# Patient Record
Sex: Female | Born: 1985 | Hispanic: No | Smoking: Never smoker
Health system: Southern US, Community
[De-identification: ages and names within clinical notes are randomized; demographics above are authoritative.]

## PROBLEM LIST (undated history)

## (undated) DIAGNOSIS — J45909 Unspecified asthma, uncomplicated: Secondary | ICD-10-CM

## (undated) DIAGNOSIS — T7840XA Allergy, unspecified, initial encounter: Secondary | ICD-10-CM

## (undated) DIAGNOSIS — I10 Essential (primary) hypertension: Secondary | ICD-10-CM

## (undated) DIAGNOSIS — E876 Hypokalemia: Secondary | ICD-10-CM

## (undated) DIAGNOSIS — D219 Benign neoplasm of connective and other soft tissue, unspecified: Secondary | ICD-10-CM

## (undated) HISTORY — DX: Essential (primary) hypertension: I10

## (undated) HISTORY — PX: NO PAST SURGERIES: SHX2092

## (undated) HISTORY — DX: Benign neoplasm of connective and other soft tissue, unspecified: D21.9

## (undated) HISTORY — DX: Allergy, unspecified, initial encounter: T78.40XA

---

## 2006-03-04 LAB — OB RESULTS CONSOLE VARICELLA ZOSTER ANTIBODY, IGG: VARICELLA IGG: IMMUNE

## 2006-04-24 ENCOUNTER — Ambulatory Visit (HOSPITAL_COMMUNITY): Admission: RE | Admit: 2006-04-24 | Discharge: 2006-04-24 | Payer: Self-pay | Admitting: Family Medicine

## 2006-08-09 ENCOUNTER — Inpatient Hospital Stay (HOSPITAL_COMMUNITY): Admission: AD | Admit: 2006-08-09 | Discharge: 2006-08-11 | Payer: Self-pay | Admitting: Family Medicine

## 2006-08-09 ENCOUNTER — Encounter (INDEPENDENT_AMBULATORY_CARE_PROVIDER_SITE_OTHER): Payer: Self-pay | Admitting: Specialist

## 2006-08-09 ENCOUNTER — Ambulatory Visit: Payer: Self-pay | Admitting: Obstetrics and Gynecology

## 2006-08-09 DIAGNOSIS — O1490 Unspecified pre-eclampsia, unspecified trimester: Secondary | ICD-10-CM

## 2006-08-12 ENCOUNTER — Encounter: Admission: RE | Admit: 2006-08-12 | Discharge: 2006-08-20 | Payer: Self-pay | Admitting: Obstetrics & Gynecology

## 2010-07-16 ENCOUNTER — Ambulatory Visit (INDEPENDENT_AMBULATORY_CARE_PROVIDER_SITE_OTHER): Payer: BC Managed Care – HMO | Admitting: Gynecology

## 2010-07-16 ENCOUNTER — Other Ambulatory Visit (HOSPITAL_COMMUNITY)
Admission: RE | Admit: 2010-07-16 | Discharge: 2010-07-16 | Disposition: A | Discharge status: Home / Self Care | Payer: BC Managed Care – HMO | Source: Ambulatory Visit | Attending: Gynecology | Admitting: Gynecology

## 2010-07-16 ENCOUNTER — Other Ambulatory Visit: Payer: Self-pay | Admitting: Gynecology

## 2010-07-16 DIAGNOSIS — Z01419 Encounter for gynecological examination (general) (routine) without abnormal findings: Secondary | ICD-10-CM

## 2010-07-16 DIAGNOSIS — N926 Irregular menstruation, unspecified: Secondary | ICD-10-CM

## 2010-07-16 DIAGNOSIS — Z124 Encounter for screening for malignant neoplasm of cervix: Secondary | ICD-10-CM | POA: Insufficient documentation

## 2010-07-17 ENCOUNTER — Ambulatory Visit (INDEPENDENT_AMBULATORY_CARE_PROVIDER_SITE_OTHER): Payer: BC Managed Care – HMO | Admitting: Gynecology

## 2010-07-17 ENCOUNTER — Other Ambulatory Visit: Payer: BC Managed Care – HMO

## 2010-07-17 DIAGNOSIS — D3910 Neoplasm of uncertain behavior of unspecified ovary: Secondary | ICD-10-CM

## 2010-07-17 DIAGNOSIS — N926 Irregular menstruation, unspecified: Secondary | ICD-10-CM

## 2010-07-17 DIAGNOSIS — N83209 Unspecified ovarian cyst, unspecified side: Secondary | ICD-10-CM

## 2010-11-14 ENCOUNTER — Encounter: Payer: Self-pay | Admitting: *Deleted

## 2010-11-15 ENCOUNTER — Encounter: Payer: Self-pay | Admitting: Gynecology

## 2010-11-15 ENCOUNTER — Ambulatory Visit (INDEPENDENT_AMBULATORY_CARE_PROVIDER_SITE_OTHER): Payer: BC Managed Care – HMO | Admitting: Gynecology

## 2010-11-15 DIAGNOSIS — N912 Amenorrhea, unspecified: Secondary | ICD-10-CM

## 2010-11-15 MED ORDER — MEDROXYPROGESTERONE ACETATE 10 MG PO TABS: 10.0000 mg | ORAL_TABLET | Freq: Two times a day (BID) | ORAL | Status: DC

## 2010-11-15 NOTE — Progress Notes (Signed)
Patient presents having her last menstrual period 09/15/2010. She was seen in April after missing march and April menses had a negative hCG was withdrawn on Provera.  She had a menses May and June and then now has skipped again.  She's not having any skin hair weight changes.  Exam Abdomen: Soft nontender without masses guarding rebound organomegaly Pelvic: External BUS vagina normal, cervix normal, uterus normal size midline mobile nontender adnexa without masses or tenderness  Assessment plan #1 Irregular menses. UPT is negative today. We'll go ahead and withdraw her on Provera 10 mg twice a day x5 days. Check baseline TSH prolactin.  She and her husband are wanting to get pregnant. I again emphasized the need to see her primary and to switch her from lisinopril to another antihypertensive before pregnancy and she is in the process of arranging this. She is on a prenatal vitamin will continue on this. I reviewed with her that once we get serious about trying for pregnancy if she continues with irregular menses we may want to consider Clomid stimulation. What is involved with Clomid was discussed to include the risks of hyperstimulation syndrome, multiple gestation as well as possible ovarian cancer leakage. Patient will go ahead and withdraw with Provera.  Assuming she has withdrawal,  to keep a menstrual calendar and report if she has another bout of amenorrhea.  She will followup on her prolactin TSH results. She does have an appointment to see her primary next month to switch from lisinopril.

## 2010-11-23 ENCOUNTER — Telehealth: Payer: Self-pay | Admitting: *Deleted

## 2010-11-23 NOTE — Telephone Encounter (Signed)
Pt called wanting lab result from last office visit. Lm on pt vm that all labs came back normal.

## 2011-10-30 ENCOUNTER — Encounter: Payer: BC Managed Care – HMO | Admitting: Gynecology

## 2014-02-07 ENCOUNTER — Encounter: Payer: Self-pay | Admitting: Gynecology

## 2015-02-27 ENCOUNTER — Other Ambulatory Visit (HOSPITAL_COMMUNITY): Payer: Self-pay | Admitting: Nurse Practitioner

## 2015-02-27 DIAGNOSIS — Z3682 Encounter for antenatal screening for nuchal translucency: Secondary | ICD-10-CM

## 2015-02-27 LAB — OB RESULTS CONSOLE HGB/HCT, BLOOD
HCT: 38 %
Hemoglobin: 12.3 g/dL

## 2015-02-27 LAB — CYSTIC FIBROSIS DIAGNOSTIC STUDY

## 2015-02-27 LAB — OB RESULTS CONSOLE ABO/RH: RH TYPE: POSITIVE

## 2015-02-27 LAB — OB RESULTS CONSOLE GC/CHLAMYDIA
CHLAMYDIA, DNA PROBE: NEGATIVE
GC PROBE AMP, GENITAL: NEGATIVE

## 2015-02-27 LAB — GLUCOSE TOLERANCE, 1 HOUR: GLUCOSE 1 HOUR GTT: 126

## 2015-02-27 LAB — OB RESULTS CONSOLE HEPATITIS B SURFACE ANTIGEN: HEP B S AG: NEGATIVE

## 2015-02-27 LAB — OB RESULTS CONSOLE PLATELET COUNT: Platelets: 178 10*3/uL

## 2015-02-27 LAB — OB RESULTS CONSOLE RPR: RPR: NONREACTIVE

## 2015-02-27 LAB — OB RESULTS CONSOLE RUBELLA ANTIBODY, IGM: Rubella: IMMUNE

## 2015-02-27 LAB — OB RESULTS CONSOLE HIV ANTIBODY (ROUTINE TESTING): HIV: NONREACTIVE

## 2015-02-27 LAB — CYTOLOGY - PAP: PAP SMEAR: NEGATIVE

## 2015-02-27 LAB — OB RESULTS CONSOLE ANTIBODY SCREEN: Antibody Screen: NEGATIVE

## 2015-03-15 ENCOUNTER — Ambulatory Visit (HOSPITAL_COMMUNITY)
Admission: RE | Admit: 2015-03-15 | Discharge: 2015-03-15 | Disposition: A | Discharge status: Home / Self Care | Payer: Medicaid Other | Source: Ambulatory Visit | Attending: Nurse Practitioner | Admitting: Nurse Practitioner

## 2015-03-15 ENCOUNTER — Encounter (HOSPITAL_COMMUNITY): Payer: Self-pay

## 2015-03-15 DIAGNOSIS — Z36 Encounter for antenatal screening of mother: Secondary | ICD-10-CM | POA: Insufficient documentation

## 2015-03-15 DIAGNOSIS — O3411 Maternal care for benign tumor of corpus uteri, first trimester: Secondary | ICD-10-CM | POA: Insufficient documentation

## 2015-03-15 DIAGNOSIS — O09291 Supervision of pregnancy with other poor reproductive or obstetric history, first trimester: Secondary | ICD-10-CM | POA: Diagnosis not present

## 2015-03-15 DIAGNOSIS — O09211 Supervision of pregnancy with history of pre-term labor, first trimester: Secondary | ICD-10-CM | POA: Insufficient documentation

## 2015-03-15 DIAGNOSIS — O10011 Pre-existing essential hypertension complicating pregnancy, first trimester: Secondary | ICD-10-CM | POA: Insufficient documentation

## 2015-03-15 DIAGNOSIS — Z3682 Encounter for antenatal screening for nuchal translucency: Secondary | ICD-10-CM

## 2015-03-15 DIAGNOSIS — Z3A13 13 weeks gestation of pregnancy: Secondary | ICD-10-CM | POA: Diagnosis not present

## 2015-03-16 ENCOUNTER — Encounter: Payer: Self-pay | Admitting: Obstetrics & Gynecology

## 2015-03-16 ENCOUNTER — Encounter: Payer: Self-pay | Admitting: *Deleted

## 2015-03-16 ENCOUNTER — Ambulatory Visit (INDEPENDENT_AMBULATORY_CARE_PROVIDER_SITE_OTHER): Payer: Self-pay | Admitting: Obstetrics & Gynecology

## 2015-03-16 VITALS — BP 136/84 | HR 86 | Temp 98.7°F | Ht 61.0 in | Wt 175.8 lb

## 2015-03-16 DIAGNOSIS — O162 Unspecified maternal hypertension, second trimester: Secondary | ICD-10-CM

## 2015-03-16 DIAGNOSIS — O10919 Unspecified pre-existing hypertension complicating pregnancy, unspecified trimester: Secondary | ICD-10-CM | POA: Insufficient documentation

## 2015-03-16 DIAGNOSIS — O0990 Supervision of high risk pregnancy, unspecified, unspecified trimester: Secondary | ICD-10-CM | POA: Insufficient documentation

## 2015-03-16 DIAGNOSIS — O09292 Supervision of pregnancy with other poor reproductive or obstetric history, second trimester: Secondary | ICD-10-CM

## 2015-03-16 DIAGNOSIS — O09299 Supervision of pregnancy with other poor reproductive or obstetric history, unspecified trimester: Secondary | ICD-10-CM | POA: Insufficient documentation

## 2015-03-16 DIAGNOSIS — O0992 Supervision of high risk pregnancy, unspecified, second trimester: Secondary | ICD-10-CM

## 2015-03-16 LAB — POCT URINALYSIS DIP (DEVICE)
BILIRUBIN URINE: NEGATIVE
Glucose, UA: NEGATIVE mg/dL
Hgb urine dipstick: NEGATIVE
Ketones, ur: NEGATIVE mg/dL
NITRITE: NEGATIVE
Protein, ur: NEGATIVE mg/dL
Specific Gravity, Urine: 1.025 (ref 1.005–1.030)
UROBILINOGEN UA: 0.2 mg/dL (ref 0.0–1.0)
pH: 6 (ref 5.0–8.0)

## 2015-03-16 NOTE — Progress Notes (Signed)
   Subjective:transfer for h/o HTN and preeclampsia    Jasmine Baird is a [id number] [redacted]w[redacted]d being seen today for her first obstetrical visit.  Her obstetrical history is significant for pre-eclampsia and HTN, previously taking lisinopril. Patient does intend to breast feed. Pregnancy history fully reviewed.  Patient reports no complaints.  Filed Vitals:   03/16/15 0749 03/16/15 0750  BP: 136/84   Pulse: 86   Temp: 98.7 F (37.1 C)   Height:  5\' 1"  (1.549 m)  Weight: 175 lb 12.8 oz (79.742 kg)     HISTORY: OB History  Gravida Para Term Preterm AB SAB TAB Ectopic Multiple Living  3 1 0 1 1 0 1 0 0 1     # Outcome Date GA Lbr Len/2nd Weight Sex Delivery Anes PTL Lv  3 Current           2 Preterm 08/09/06 [redacted]w[redacted]d  4 lb 9 oz (2.07 kg) M Vag-Spont  Y Y     Complications: Pre-eclampsia  1 TAB              Past Medical History  Diagnosis Date  . Hypertension    History reviewed. No pertinent past surgical history. Family History  Problem Relation Age of Onset  . Hypertension Mother   . Hypertension Father   . Hyperlipidemia Father   . Heart disease Maternal Uncle   . Diabetes Maternal Grandmother   . Hypertension Maternal Grandmother   . Cancer Maternal Grandfather 21    PANCREATIC     Exam    Uterus:     Pelvic Exam:                               System:     Skin: normal coloration and turgor, no rashes    Neurologic: oriented, normal mood   Extremities: normal strength, tone, and muscle mass   HEENT extra ocular movement intact   Mouth/Teeth dental hygiene good   Neck supple   Cardiovascular: regular rate and rhythm   Respiratory:  appears well, vitals normal, no respiratory distress, acyanotic, normal RR, ear and throat exam is normal   Abdomen: soft, non-tender; bowel sounds normal; no masses,  no organomegaly   Urinary:        Assessment:    Pregnancy: [id number] Patient Active Problem List   Diagnosis Date Noted  . Supervision of high risk  pregnancy, antepartum 03/16/2015  . Hx of preeclampsia, prior pregnancy, currently pregnant 03/16/2015  CHTN history off meds now      Plan:     Initial labs drawn. Prenatal vitamins. Problem list reviewed and updated. Genetic Screening discussed First Screen: results reviewed.normal  Ultrasound discussed; fetal survey: ordered.  Follow up in 4 weeks. 50% of 30 min visit spent on counseling and coordination of care.  ASA 81 mg daily   Eriel Dunckel 03/16/2015

## 2015-03-16 NOTE — Progress Notes (Signed)
Anatomy U/S 04/20/15 @ 10a with [place].

## 2015-03-16 NOTE — Progress Notes (Signed)
Nutrition note: 1st visit consult Pt has h/o obesity. Pt has gained 15.8# @ [redacted]w[redacted]d, which is > expected. Pt reports eating 3 meals & 3 snacks/d. Pt is taking a PNV. Pt reports no N&V or heartburn. NKFA. Pt received verbal & written education on general nutrition during pregnancy.  Discussed wt gain goals of 11-20# or 0.5#/wk. Pt agrees to continue taking her PNV. Pt does not have [place] but plans to apply. Pt plans to BF. F/u as needed Vladimir Faster, MS, RD, LDN, [facility]

## 2015-03-16 NOTE — Patient Instructions (Signed)

## 2015-03-28 ENCOUNTER — Other Ambulatory Visit (HOSPITAL_COMMUNITY): Payer: Self-pay

## 2015-04-09 NOTE — L&D Delivery Note (Signed)
Patient is 30 y.o. [id number] [redacted]w[redacted]d admitted IOL out of concern for SIPE   Delivery Note At 3:35 AM a viable female was delivered via Vaginal, Spontaneous Delivery (Presentation: Left Occiput Anterior).  APGAR: 9, 9; weight pending  .   Placenta status: Intact, Spontaneous.  Cord: 3 vessels with the following complications: Knot.  Cord pH: n/a   Anesthesia: Epidural  Episiotomy: None Lacerations: 2nd degree Suture Repair: 3.0 Est. Blood Loss (mL): 300. Placed rectal Cytotec (prophylactic)    Mom to postpartum.  Baby to Couplet care / Skin to Skin.  Mercy Riding 09/07/2015, 4:22 AM  I was present for the entire delivery of baby and placenta and repair and agree with above.  [place], CNM 09/07/2015 8:01 AM

## 2015-04-13 ENCOUNTER — Ambulatory Visit (INDEPENDENT_AMBULATORY_CARE_PROVIDER_SITE_OTHER): Payer: Medicaid Other | Admitting: Family

## 2015-04-13 VITALS — BP 141/91 | HR 93 | Temp 99.0°F | Wt 180.5 lb

## 2015-04-13 DIAGNOSIS — O162 Unspecified maternal hypertension, second trimester: Secondary | ICD-10-CM | POA: Diagnosis not present

## 2015-04-13 DIAGNOSIS — O09292 Supervision of pregnancy with other poor reproductive or obstetric history, second trimester: Secondary | ICD-10-CM | POA: Diagnosis not present

## 2015-04-13 DIAGNOSIS — O09213 Supervision of pregnancy with history of pre-term labor, third trimester: Secondary | ICD-10-CM

## 2015-04-13 DIAGNOSIS — O0992 Supervision of high risk pregnancy, unspecified, second trimester: Secondary | ICD-10-CM

## 2015-04-13 LAB — POCT URINALYSIS DIP (DEVICE)
BILIRUBIN URINE: NEGATIVE
GLUCOSE, UA: NEGATIVE mg/dL
Hgb urine dipstick: NEGATIVE
KETONES UR: NEGATIVE mg/dL
Nitrite: NEGATIVE
PROTEIN: NEGATIVE mg/dL
Urobilinogen, UA: 0.2 mg/dL (ref 0.0–1.0)
pH: 5.5 (ref 5.0–8.0)

## 2015-04-13 NOTE — Progress Notes (Signed)
Subjective:  Jasmine Baird is a 30 y.o. [phone] at [redacted]w[redacted]d being seen today for ongoing prenatal care.  She is currently monitored for the following issues for this high-risk pregnancy and has supervision of high risk pregnancy, antepartum; Hx of preeclampsia, prior pregnancy, currently pregnant; hypertension affecting pregnancy, antepartum; and previous preterm delivery in third trimester, antepartum on her problem list.  Patient reports no complaints.  Contractions: Not present. Vag. Bleeding: None.   . Denies leaking of fluid.   The following portions of the patient's history were reviewed and updated as appropriate: allergies, current medications, past family history, past medical history, past social history, past surgical history and problem list. Problem list updated.  Objective:   Filed Vitals:   04/13/15 0801  BP: 141/91  Pulse: 93  Temp: 99 F (37.2 C)  Weight: 180 lb 8 oz (81.874 kg)    Fetal Status: Fetal Heart Rate (bpm): 145 Fundal Height: 17 cm       General:  Alert, oriented and cooperative. Patient is in no acute distress.  Skin: Skin is warm and dry. No rash noted.   Cardiovascular: Normal heart rate noted  Respiratory: Normal respiratory effort, no problems with respiration noted  Abdomen: Soft, gravid, appropriate for gestational age. Pain/Pressure: Present     Pelvic: Vag. Bleeding: None     Cervical exam deferred        Extremities: Normal range of motion.  Edema: Trace  Mental Status: Normal mood and affect. Normal behavior. Normal judgment and thought content.   Urinalysis:     Urine results not available at discharge.     Assessment and Plan:  Pregnancy: [id number] at [redacted]w[redacted]d  1. Supervision of high risk pregnancy, antepartum, second trimester - Reviewed prenatal labs and NT results  2. Hx of preeclampsia, prior pregnancy, currently pregnant, second trimester - Taking baby ASA - Obtain results from Health Dept for Prex labs  3. Hypertension affecting  pregnancy, antepartum, second trimester - Follow-up next week for BP check per patient request  4. Previous preterm delivery in third trimester, antepartum - Given information regarding 17-p and will decide  Preterm labor symptoms and general obstetric precautions including but not limited to vaginal bleeding, contractions, leaking of fluid and fetal movement were reviewed in detail with the patient. Please refer to After Visit Summary for other counseling recommendations.  Return in about 1 week (around 04/20/2015) for BP check only; 3 wks appt.   Venia Carbon Michiel Cowboy, CNM

## 2015-04-20 ENCOUNTER — Encounter (HOSPITAL_COMMUNITY): Payer: Self-pay

## 2015-04-20 ENCOUNTER — Encounter: Payer: Self-pay | Admitting: *Deleted

## 2015-04-20 ENCOUNTER — Other Ambulatory Visit: Payer: Self-pay | Admitting: General Practice

## 2015-04-20 ENCOUNTER — Ambulatory Visit: Payer: Medicaid Other | Admitting: *Deleted

## 2015-04-20 ENCOUNTER — Ambulatory Visit (HOSPITAL_COMMUNITY)
Admission: RE | Admit: 2015-04-20 | Discharge: 2015-04-20 | Disposition: A | Discharge status: Home / Self Care | Payer: Medicaid Other | Source: Ambulatory Visit | Attending: Obstetrics & Gynecology | Admitting: Obstetrics & Gynecology

## 2015-04-20 VITALS — BP 125/78 | HR 89

## 2015-04-20 DIAGNOSIS — O10012 Pre-existing essential hypertension complicating pregnancy, second trimester: Secondary | ICD-10-CM | POA: Diagnosis present

## 2015-04-20 DIAGNOSIS — O09292 Supervision of pregnancy with other poor reproductive or obstetric history, second trimester: Secondary | ICD-10-CM

## 2015-04-20 DIAGNOSIS — O162 Unspecified maternal hypertension, second trimester: Secondary | ICD-10-CM

## 2015-04-20 DIAGNOSIS — O09219 Supervision of pregnancy with history of pre-term labor, unspecified trimester: Secondary | ICD-10-CM

## 2015-04-20 DIAGNOSIS — O09899 Supervision of other high risk pregnancies, unspecified trimester: Secondary | ICD-10-CM

## 2015-04-20 DIAGNOSIS — Z3A18 18 weeks gestation of pregnancy: Secondary | ICD-10-CM

## 2015-04-20 DIAGNOSIS — Z36 Encounter for antenatal screening of mother: Secondary | ICD-10-CM | POA: Insufficient documentation

## 2015-04-20 DIAGNOSIS — O09212 Supervision of pregnancy with history of pre-term labor, second trimester: Secondary | ICD-10-CM | POA: Diagnosis not present

## 2015-04-20 DIAGNOSIS — D259 Leiomyoma of uterus, unspecified: Secondary | ICD-10-CM

## 2015-04-20 DIAGNOSIS — O3412 Maternal care for benign tumor of corpus uteri, second trimester: Secondary | ICD-10-CM | POA: Insufficient documentation

## 2015-04-20 DIAGNOSIS — Z0130 Encounter for examination of blood pressure without abnormal findings: Secondary | ICD-10-CM

## 2015-04-20 DIAGNOSIS — Z3689 Encounter for other specified antenatal screening: Secondary | ICD-10-CM

## 2015-04-20 DIAGNOSIS — O0992 Supervision of high risk pregnancy, unspecified, second trimester: Secondary | ICD-10-CM

## 2015-04-20 NOTE — Progress Notes (Signed)
Pt in for BP Check. Feeling well. BP 125/78.

## 2015-05-02 ENCOUNTER — Encounter: Payer: Self-pay | Admitting: Family

## 2015-05-04 ENCOUNTER — Telehealth: Payer: Self-pay

## 2015-05-04 ENCOUNTER — Ambulatory Visit (INDEPENDENT_AMBULATORY_CARE_PROVIDER_SITE_OTHER): Payer: Medicaid Other | Admitting: Family

## 2015-05-04 VITALS — BP 126/83 | HR 102 | Wt 180.1 lb

## 2015-05-04 DIAGNOSIS — O10912 Unspecified pre-existing hypertension complicating pregnancy, second trimester: Secondary | ICD-10-CM

## 2015-05-04 DIAGNOSIS — O09292 Supervision of pregnancy with other poor reproductive or obstetric history, second trimester: Secondary | ICD-10-CM

## 2015-05-04 DIAGNOSIS — O162 Unspecified maternal hypertension, second trimester: Secondary | ICD-10-CM

## 2015-05-04 DIAGNOSIS — O09293 Supervision of pregnancy with other poor reproductive or obstetric history, third trimester: Secondary | ICD-10-CM

## 2015-05-04 DIAGNOSIS — O09212 Supervision of pregnancy with history of pre-term labor, second trimester: Secondary | ICD-10-CM | POA: Diagnosis not present

## 2015-05-04 DIAGNOSIS — O0992 Supervision of high risk pregnancy, unspecified, second trimester: Secondary | ICD-10-CM

## 2015-05-04 DIAGNOSIS — O09213 Supervision of pregnancy with history of pre-term labor, third trimester: Secondary | ICD-10-CM

## 2015-05-04 LAB — POCT URINALYSIS DIP (DEVICE)
Glucose, UA: NEGATIVE mg/dL
Hgb urine dipstick: NEGATIVE
Ketones, ur: NEGATIVE mg/dL
NITRITE: NEGATIVE
Protein, ur: 30 mg/dL — AB
Specific Gravity, Urine: 1.02 (ref 1.005–1.030)
UROBILINOGEN UA: 0.2 mg/dL (ref 0.0–1.0)
pH: 6 (ref 5.0–8.0)

## 2015-05-04 NOTE — Progress Notes (Signed)
Subjective:  Jasmine Baird is a 30 y.o. [phone] at [redacted]w[redacted]d being seen today for ongoing prenatal care.  She is currently monitored for the following issues for this high-risk pregnancy and has supervision of high risk pregnancy, antepartum; Hx of preeclampsia, prior pregnancy, currently pregnant; Hypertension affecting pregnancy, antepartum; and Previous preterm delivery in third trimester, antepartum on her problem list.  Patient reports no complaints.  Agrees to doing 17-p.  Contractions: Not present. Vag. Bleeding: None.  Movement: Present. Denies leaking of fluid.   The following portions of the patient's history were reviewed and updated as appropriate: allergies, current medications, past family history, past medical history, past social history, past surgical history and problem list. Problem list updated.  Objective:   Filed Vitals:   05/04/15 0810  BP: 126/83  Pulse: 102  Weight: 180 lb 1.6 oz (81.693 kg)    Fetal Status: Fetal Heart Rate (bpm): 143 Fundal Height: 21 cm Movement: Present     General:  Alert, oriented and cooperative. Patient is in no acute distress.  Skin: Skin is warm and dry. No rash noted.   Cardiovascular: Normal heart rate noted  Respiratory: Normal respiratory effort, no problems with respiration noted  Abdomen: Soft, gravid, appropriate for gestational age. Pain/Pressure: Absent     Pelvic: Vag. Bleeding: None     Cervical exam deferred        Extremities: Normal range of motion.  Edema: Trace  Mental Status: Normal mood and affect. Normal behavior. Normal judgment and thought content.   Urinalysis: Urine Protein: 1+ Urine Glucose: Negative  Assessment and Plan:  Pregnancy: [id number] at [redacted]w[redacted]d  1. Hx of preeclampsia, prior pregnancy, currently pregnant, third trimester - Continue ASA  2. Supervision of high risk pregnancy, antepartum, second trimester - Reviewed lab results  3. Previous preterm delivery in third trimester, antepartum - Signed  [id number] application  Preterm labor symptoms and general obstetric precautions including but not limited to vaginal bleeding, contractions, leaking of fluid and fetal movement were reviewed in detail with the patient. Please refer to After Visit Summary for other counseling recommendations.  Return in about 3 weeks (around 05/25/2015).  Return sooner once 17-p arrives.     Venia Carbon Michiel Cowboy, CNM

## 2015-05-04 NOTE — Telephone Encounter (Signed)
At the request of Walidah please obtain this patient 24 hour urine and cmp from the health dept. I ask Butch Penny from the health department to check on these results. She has put in a request from the health department.

## 2015-05-04 NOTE — Patient Instructions (Signed)

## 2015-05-05 ENCOUNTER — Encounter: Payer: Self-pay | Admitting: Family

## 2015-05-08 ENCOUNTER — Telehealth: Payer: Self-pay

## 2015-05-08 NOTE — Telephone Encounter (Signed)
Called and informed pt that we have received her Makena.  Pt stated that she will be able to come in on Thursday, 05/11/15 @ 0800 for her first injection.

## 2015-05-11 ENCOUNTER — Ambulatory Visit (INDEPENDENT_AMBULATORY_CARE_PROVIDER_SITE_OTHER): Payer: Medicaid Other | Admitting: General Practice

## 2015-05-11 VITALS — BP 130/83 | HR 115 | Temp 98.1°F | Ht 61.0 in | Wt 182.0 lb

## 2015-05-11 DIAGNOSIS — O09213 Supervision of pregnancy with history of pre-term labor, third trimester: Secondary | ICD-10-CM | POA: Diagnosis not present

## 2015-05-11 MED ORDER — HYDROXYPROGESTERONE CAPROATE 250 MG/ML IM OIL
250.0000 mg | TOPICAL_OIL | INTRAMUSCULAR | Status: AC
  Administered 2015-05-11 – 2015-08-15 (×14): 250 mg via INTRAMUSCULAR

## 2015-05-18 ENCOUNTER — Ambulatory Visit (INDEPENDENT_AMBULATORY_CARE_PROVIDER_SITE_OTHER): Payer: Medicaid Other

## 2015-05-18 VITALS — BP 135/75 | HR 91 | Temp 98.5°F | Wt 183.5 lb

## 2015-05-18 DIAGNOSIS — O09213 Supervision of pregnancy with history of pre-term labor, third trimester: Secondary | ICD-10-CM | POA: Diagnosis present

## 2015-05-25 ENCOUNTER — Ambulatory Visit (INDEPENDENT_AMBULATORY_CARE_PROVIDER_SITE_OTHER): Payer: Medicaid Other

## 2015-05-25 VITALS — BP 124/93 | HR 92 | Temp 98.5°F | Wt 185.6 lb

## 2015-05-25 DIAGNOSIS — O0992 Supervision of high risk pregnancy, unspecified, second trimester: Secondary | ICD-10-CM

## 2015-05-26 ENCOUNTER — Encounter: Payer: Self-pay | Admitting: Family

## 2015-06-01 ENCOUNTER — Ambulatory Visit (INDEPENDENT_AMBULATORY_CARE_PROVIDER_SITE_OTHER): Payer: Medicaid Other | Admitting: Family

## 2015-06-01 VITALS — BP 127/72 | HR 100 | Temp 98.4°F | Wt 186.6 lb

## 2015-06-01 DIAGNOSIS — O0992 Supervision of high risk pregnancy, unspecified, second trimester: Secondary | ICD-10-CM

## 2015-06-01 DIAGNOSIS — O09213 Supervision of pregnancy with history of pre-term labor, third trimester: Secondary | ICD-10-CM

## 2015-06-01 DIAGNOSIS — O09212 Supervision of pregnancy with history of pre-term labor, second trimester: Secondary | ICD-10-CM | POA: Diagnosis not present

## 2015-06-01 LAB — POCT URINALYSIS DIP (DEVICE)
Bilirubin Urine: NEGATIVE
Glucose, UA: NEGATIVE mg/dL
HGB URINE DIPSTICK: NEGATIVE
Ketones, ur: NEGATIVE mg/dL
Nitrite: NEGATIVE
PH: 6 (ref 5.0–8.0)
PROTEIN: NEGATIVE mg/dL
SPECIFIC GRAVITY, URINE: 1.02 (ref 1.005–1.030)
UROBILINOGEN UA: 0.2 mg/dL (ref 0.0–1.0)

## 2015-06-01 NOTE — Progress Notes (Signed)
Subjective:  Jasmine Baird is a 30 y.o. [phone] at [redacted]w[redacted]d being seen today for ongoing prenatal care.  She is currently monitored for the following issues for this high-risk pregnancy and has Supervision of high risk pregnancy, antepartum; Hx of preeclampsia, prior pregnancy, currently pregnant; Hypertension affecting pregnancy, antepartum; and Previous preterm delivery in third trimester, antepartum on her problem list.  Patient reports no complaints.  Contractions: Not present. Vag. Bleeding: None.  Movement: Present. Denies leaking of fluid.   The following portions of the patient's history were reviewed and updated as appropriate: allergies, current medications, past family history, past medical history, past social history, past surgical history and problem list. Problem list updated.  Objective:   Filed Vitals:   06/01/15 0753  BP: 127/72  Pulse: 100  Temp: 98.4 F (36.9 C)  Weight: 186 lb 9.6 oz (84.641 kg)    Fetal Status: Fetal Heart Rate (bpm): 156 Fundal Height: 26 cm Movement: Present     General:  Alert, oriented and cooperative. Patient is in no acute distress.  Skin: Skin is warm and dry. No rash noted.   Cardiovascular: Normal heart rate noted  Respiratory: Normal respiratory effort, no problems with respiration noted  Abdomen: Soft, gravid, appropriate for gestational age. Pain/Pressure: Absent     Pelvic: Vag. Bleeding: None     Cervical exam deferred        Extremities: Normal range of motion.     Mental Status: Normal mood and affect. Normal behavior. Normal judgment and thought content.   Urinalysis: Urine Protein: Negative Urine Glucose: Negative  Assessment and Plan:  Pregnancy: [id number] at [redacted]w[redacted]d  1. Previous preterm delivery in third trimester, antepartum - 17p today  2. Supervision of high risk pregnancy, antepartum, second trimester - Reviewed third trimester labs for next visit  Preterm labor symptoms and general obstetric precautions including but  not limited to vaginal bleeding, contractions, leaking of fluid and fetal movement were reviewed in detail with the patient. Please refer to After Visit Summary for other counseling recommendations.  Return in about 3 weeks (around 06/22/2015) for 17p weekly.  Venia Carbon Michiel Cowboy, CNM

## 2015-06-08 ENCOUNTER — Ambulatory Visit (INDEPENDENT_AMBULATORY_CARE_PROVIDER_SITE_OTHER): Payer: Medicaid Other | Admitting: General Practice

## 2015-06-08 VITALS — BP 141/82 | HR 108 | Temp 98.6°F | Wt 188.0 lb

## 2015-06-08 DIAGNOSIS — O09212 Supervision of pregnancy with history of pre-term labor, second trimester: Secondary | ICD-10-CM | POA: Diagnosis present

## 2015-06-08 MED ORDER — HYDROXYPROGESTERONE CAPROATE 250 MG/ML IM OIL
250.0000 mg | TOPICAL_OIL | Freq: Once | INTRAMUSCULAR | Status: AC
  Administered 2015-08-22: 250 mg via INTRAMUSCULAR

## 2015-06-15 ENCOUNTER — Encounter: Payer: Self-pay | Admitting: *Deleted

## 2015-06-15 ENCOUNTER — Ambulatory Visit (INDEPENDENT_AMBULATORY_CARE_PROVIDER_SITE_OTHER): Payer: Medicaid Other | Admitting: *Deleted

## 2015-06-15 VITALS — BP 142/89 | HR 97 | Temp 98.4°F | Wt 191.1 lb

## 2015-06-15 DIAGNOSIS — O09212 Supervision of pregnancy with history of pre-term labor, second trimester: Secondary | ICD-10-CM

## 2015-06-22 ENCOUNTER — Ambulatory Visit (INDEPENDENT_AMBULATORY_CARE_PROVIDER_SITE_OTHER): Payer: Medicaid Other | Admitting: Family

## 2015-06-22 VITALS — BP 117/80 | HR 86 | Temp 98.5°F | Wt 193.8 lb

## 2015-06-22 DIAGNOSIS — O09213 Supervision of pregnancy with history of pre-term labor, third trimester: Secondary | ICD-10-CM

## 2015-06-22 DIAGNOSIS — O162 Unspecified maternal hypertension, second trimester: Secondary | ICD-10-CM | POA: Diagnosis present

## 2015-06-22 DIAGNOSIS — O09212 Supervision of pregnancy with history of pre-term labor, second trimester: Secondary | ICD-10-CM

## 2015-06-22 DIAGNOSIS — Z364 Encounter for antenatal screening for fetal growth retardation: Secondary | ICD-10-CM

## 2015-06-22 DIAGNOSIS — O0993 Supervision of high risk pregnancy, unspecified, third trimester: Secondary | ICD-10-CM

## 2015-06-22 LAB — CBC
HEMATOCRIT: 35.5 % — AB (ref 36.0–46.0)
Hemoglobin: 11.6 g/dL — ABNORMAL LOW (ref 12.0–15.0)
MCH: 27.7 pg (ref 26.0–34.0)
MCHC: 32.7 g/dL (ref 30.0–36.0)
MCV: 84.7 fL (ref 78.0–100.0)
MPV: 11.4 fL (ref 8.6–12.4)
Platelets: 115 10*3/uL — ABNORMAL LOW (ref 150–400)
RBC: 4.19 MIL/uL (ref 3.87–5.11)
RDW: 14 % (ref 11.5–15.5)
WBC: 9.8 10*3/uL (ref 4.0–10.5)

## 2015-06-22 LAB — POCT URINALYSIS DIP (DEVICE)
Bilirubin Urine: NEGATIVE
Glucose, UA: NEGATIVE mg/dL
HGB URINE DIPSTICK: NEGATIVE
Ketones, ur: NEGATIVE mg/dL
LEUKOCYTES UA: NEGATIVE
NITRITE: NEGATIVE
PH: 7 (ref 5.0–8.0)
PROTEIN: NEGATIVE mg/dL
Specific Gravity, Urine: 1.015 (ref 1.005–1.030)
UROBILINOGEN UA: 0.2 mg/dL (ref 0.0–1.0)

## 2015-06-22 NOTE — Progress Notes (Signed)
Subjective:  Jasmine Baird is a 30 y.o. [phone] at [redacted]w[redacted]d being seen today for ongoing prenatal care.  She is currently monitored for the following issues for this high-risk pregnancy and has Supervision of high risk pregnancy, antepartum; Hx of preeclampsia, prior pregnancy, currently pregnant; Hypertension affecting pregnancy, antepartum; and Previous preterm delivery in third trimester, antepartum on her problem list.  Patient reports no complaints.  Contractions: Not present. Vag. Bleeding: None.  Movement: Present. Denies leaking of fluid.   The following portions of the patient's history were reviewed and updated as appropriate: allergies, current medications, past family history, past medical history, past social history, past surgical history and problem list. Problem list updated.  Objective:   Filed Vitals:   06/22/15 0804  BP: 117/80  Pulse: 86  Temp: 98.5 F (36.9 C)  Weight: 193 lb 12.8 oz (87.907 kg)    Fetal Status: Fetal Heart Rate (bpm): 161 Fundal Height: 28 cm Movement: Present     General:  Alert, oriented and cooperative. Patient is in no acute distress.  Skin: Skin is warm and dry. No rash noted.   Cardiovascular: Normal heart rate noted  Respiratory: Normal respiratory effort, no problems with respiration noted  Abdomen: Soft, gravid, appropriate for gestational age. Pain/Pressure: Absent     Pelvic: Vag. Bleeding: None     Cervical exam deferred        Extremities: Normal range of motion.  Edema: Trace  Mental Status: Normal mood and affect. Normal behavior. Normal judgment and thought content.   Urinalysis: Urine Protein: Negative Urine Glucose: Negative  Assessment and Plan:  Pregnancy: [id number] at [redacted]w[redacted]d  1. Supervision of high risk pregnancy, antepartum, third trimester - Third trimester labs today  2. Hypertension affecting pregnancy, antepartum, second trimester - [place] MFM OB FOLLOW UP; Future > growth  3. Previous preterm delivery in third  trimester, antepartum - 17-p today  Preterm labor symptoms and general obstetric precautions including but not limited to vaginal bleeding, contractions, leaking of fluid and fetal movement were reviewed in detail with the patient. Please refer to After Visit Summary for other counseling recommendations.  Return in about 2 weeks (around 07/06/2015) for 17p weekly.   Venia Carbon Michiel Cowboy, CNM

## 2015-06-22 NOTE — Addendum Note (Signed)
Addended by: Phillip Heal, DEMETRICE A on: 06/22/2015 08:43 AM   Modules accepted: Orders

## 2015-06-23 LAB — GLUCOSE TOLERANCE, 1 HOUR (50G) W/O FASTING: GLUCOSE, 1 HR, GESTATIONAL: 126 mg/dL (ref <140)

## 2015-06-23 LAB — HIV ANTIBODY (ROUTINE TESTING W REFLEX): HIV 1&2 Ab, 4th Generation: NONREACTIVE

## 2015-06-24 LAB — RPR

## 2015-06-29 ENCOUNTER — Ambulatory Visit (HOSPITAL_COMMUNITY)
Admission: RE | Admit: 2015-06-29 | Discharge: 2015-06-29 | Disposition: A | Discharge status: Home / Self Care | Payer: Medicaid Other | Source: Ambulatory Visit | Attending: Family | Admitting: Family

## 2015-06-29 ENCOUNTER — Ambulatory Visit (INDEPENDENT_AMBULATORY_CARE_PROVIDER_SITE_OTHER): Payer: Medicaid Other

## 2015-06-29 ENCOUNTER — Other Ambulatory Visit: Payer: Self-pay | Admitting: Family

## 2015-06-29 VITALS — BP 132/79 | HR 82

## 2015-06-29 VITALS — BP 146/84 | HR 84 | Wt 195.0 lb

## 2015-06-29 DIAGNOSIS — Z3A28 28 weeks gestation of pregnancy: Secondary | ICD-10-CM | POA: Insufficient documentation

## 2015-06-29 DIAGNOSIS — O09893 Supervision of other high risk pregnancies, third trimester: Secondary | ICD-10-CM

## 2015-06-29 DIAGNOSIS — O09293 Supervision of pregnancy with other poor reproductive or obstetric history, third trimester: Secondary | ICD-10-CM

## 2015-06-29 DIAGNOSIS — O3410 Maternal care for benign tumor of corpus uteri, unspecified trimester: Secondary | ICD-10-CM

## 2015-06-29 DIAGNOSIS — O3413 Maternal care for benign tumor of corpus uteri, third trimester: Secondary | ICD-10-CM | POA: Insufficient documentation

## 2015-06-29 DIAGNOSIS — Z36 Encounter for antenatal screening of mother: Secondary | ICD-10-CM | POA: Diagnosis not present

## 2015-06-29 DIAGNOSIS — O10013 Pre-existing essential hypertension complicating pregnancy, third trimester: Secondary | ICD-10-CM | POA: Insufficient documentation

## 2015-06-29 DIAGNOSIS — D259 Leiomyoma of uterus, unspecified: Secondary | ICD-10-CM | POA: Diagnosis not present

## 2015-06-29 DIAGNOSIS — O0993 Supervision of high risk pregnancy, unspecified, third trimester: Secondary | ICD-10-CM

## 2015-06-29 DIAGNOSIS — O09213 Supervision of pregnancy with history of pre-term labor, third trimester: Secondary | ICD-10-CM

## 2015-06-29 DIAGNOSIS — O163 Unspecified maternal hypertension, third trimester: Secondary | ICD-10-CM

## 2015-06-29 DIAGNOSIS — Z364 Encounter for antenatal screening for fetal growth retardation: Secondary | ICD-10-CM

## 2015-06-29 DIAGNOSIS — O162 Unspecified maternal hypertension, second trimester: Secondary | ICD-10-CM

## 2015-07-06 ENCOUNTER — Ambulatory Visit (INDEPENDENT_AMBULATORY_CARE_PROVIDER_SITE_OTHER): Payer: Medicaid Other | Admitting: Obstetrics and Gynecology

## 2015-07-06 VITALS — BP 148/85 | HR 108 | Wt 196.4 lb

## 2015-07-06 DIAGNOSIS — Z1389 Encounter for screening for other disorder: Secondary | ICD-10-CM

## 2015-07-06 DIAGNOSIS — O09293 Supervision of pregnancy with other poor reproductive or obstetric history, third trimester: Secondary | ICD-10-CM | POA: Diagnosis not present

## 2015-07-06 DIAGNOSIS — Z23 Encounter for immunization: Secondary | ICD-10-CM | POA: Diagnosis not present

## 2015-07-06 DIAGNOSIS — O10913 Unspecified pre-existing hypertension complicating pregnancy, third trimester: Secondary | ICD-10-CM

## 2015-07-06 DIAGNOSIS — O10919 Unspecified pre-existing hypertension complicating pregnancy, unspecified trimester: Secondary | ICD-10-CM

## 2015-07-06 DIAGNOSIS — O0990 Supervision of high risk pregnancy, unspecified, unspecified trimester: Secondary | ICD-10-CM

## 2015-07-06 LAB — POCT URINALYSIS DIP (DEVICE)
BILIRUBIN URINE: NEGATIVE
Glucose, UA: NEGATIVE mg/dL
Ketones, ur: NEGATIVE mg/dL
NITRITE: NEGATIVE
PH: 6 (ref 5.0–8.0)
Protein, ur: 30 mg/dL — AB
Specific Gravity, Urine: >=1.03 (ref 1.005–1.030)
UROBILINOGEN UA: 0.2 mg/dL (ref 0.0–1.0)

## 2015-07-06 NOTE — Assessment & Plan Note (Signed)
Guidelines for Antenatal Testing and Sonography             CHTN - 642.03   Group I   BP < 140/90, no preeclampsia, AGA,  nml AFV, +/- meds     Group II   BP > 140/90, on meds, no preeclampsia, AGA, nml AFV  20-28-34-38   20-24-28-32-35-38  31//2 x wk   28//BPP wkly then 31//2 x wk  39 no meds; 39 meds   PRN or 37

## 2015-07-06 NOTE — Addendum Note (Signed)
Addended by: Phillip Heal, DEMETRICE A on: 07/06/2015 08:38 AM   Modules accepted: Orders

## 2015-07-06 NOTE — Progress Notes (Signed)
Subjective:  Jasmine Baird is a 30 y.o. [phone] at [redacted]w[redacted]d being seen today for ongoing prenatal care.  She is currently monitored for the following issues for this high-risk pregnancy and has Supervision of high risk pregnancy, antepartum; Hx of preeclampsia, prior pregnancy, currently pregnant; Chronic hypertension in pregnancy; and Previous preterm delivery in third trimester, antepartum on her problem list.  Patient reports no complaints.  Contractions: Not present. Vag. Bleeding: None.  Movement: Present. Denies leaking of fluid.   The following portions of the patient's history were reviewed and updated as appropriate: allergies, current medications, past family history, past medical history, past social history, past surgical history and problem list. Problem list updated.  Objective:   Filed Vitals:   07/06/15 0810  BP: 148/85  Pulse: 108  Weight: 196 lb 6.4 oz (89.086 kg)    Fetal Status: Fetal Heart Rate (bpm): 138   Movement: Present     General:  Alert, oriented and cooperative. Patient is in no acute distress.  Skin: Skin is warm and dry. No rash noted.   Cardiovascular: Normal heart rate noted  Respiratory: Normal respiratory effort, no problems with respiration noted  Abdomen: Soft, gravid, appropriate for gestational age. Pain/Pressure: Absent     Pelvic: Vag. Bleeding: None     Cervical exam deferred        Extremities: Normal range of motion.  Edema: Trace  Mental Status: Normal mood and affect. Normal behavior. Normal judgment and thought content.   Urinalysis:      Assessment and Plan:  Pregnancy: [id number] at [redacted]w[redacted]d  1. Hx of preeclampsia, prior pregnancy, currently pregnant, third trimester - continue aspirin  2. chtn - bp appropriate, rise noted, preE symptoms reviewed, none present - cmp, upc, and 24-hour urine today - u/s for growth in ~4 weeks ordering today  3. Pregnancy - tdap - contraception discussion, wants pop  4. Hx pt delivery - 17-p,  continue weekly  Preterm labor symptoms and general obstetric precautions including but not limited to vaginal bleeding, contractions, leaking of fluid and fetal movement were reviewed in detail with the patient. Please refer to After Visit Summary for other counseling recommendations.    Gwynne Edinger, MD

## 2015-07-10 ENCOUNTER — Other Ambulatory Visit: Payer: Medicaid Other

## 2015-07-10 LAB — COMPREHENSIVE METABOLIC PANEL
ALK PHOS: 167 U/L — AB (ref 33–115)
ALT: 16 U/L (ref 6–29)
AST: 17 U/L (ref 10–30)
Albumin: 3.1 g/dL — ABNORMAL LOW (ref 3.6–5.1)
BUN: 7 mg/dL (ref 7–25)
CO2: 27 mmol/L (ref 20–31)
CREATININE: 0.57 mg/dL (ref 0.50–1.10)
Calcium: 9 mg/dL (ref 8.6–10.2)
Chloride: 104 mmol/L (ref 98–110)
GLUCOSE: 89 mg/dL (ref 65–99)
POTASSIUM: 4.1 mmol/L (ref 3.5–5.3)
SODIUM: 140 mmol/L (ref 135–146)
Total Bilirubin: 0.2 mg/dL (ref 0.2–1.2)
Total Protein: 5.4 g/dL — ABNORMAL LOW (ref 6.1–8.1)

## 2015-07-12 LAB — PROTEIN, URINE, 24 HOUR
PROTEIN 24H UR: 152 mg/(24.h) — AB (ref <150)
PROTEIN, URINE: 8 mg/dL (ref 5–24)

## 2015-07-12 LAB — CREATININE CLEARANCE, URINE, 24 HOUR
CREAT CLEAR: 206 mL/min — AB (ref 75–115)
Creatinine, 24H Ur: 1.69 g/(24.h) (ref 0.63–2.50)
Creatinine, Urine: 89 mg/dL (ref 20–320)
Creatinine: 0.57 mg/dL (ref 0.50–1.10)

## 2015-07-12 LAB — PROTEIN / CREATININE RATIO, URINE
CREATININE, URINE: 89 mg/dL (ref 20–320)
Protein Creatinine Ratio: 90 mg/g creat (ref 21–161)
Total Protein, Urine: 8 mg/dL (ref 5–24)

## 2015-07-13 ENCOUNTER — Ambulatory Visit (INDEPENDENT_AMBULATORY_CARE_PROVIDER_SITE_OTHER): Payer: Medicaid Other | Admitting: General Practice

## 2015-07-13 VITALS — BP 139/95 | HR 96 | Temp 97.6°F | Ht 61.0 in | Wt 196.0 lb

## 2015-07-13 DIAGNOSIS — O09213 Supervision of pregnancy with history of pre-term labor, third trimester: Secondary | ICD-10-CM | POA: Diagnosis not present

## 2015-07-13 DIAGNOSIS — O09893 Supervision of other high risk pregnancies, third trimester: Secondary | ICD-10-CM

## 2015-07-20 ENCOUNTER — Ambulatory Visit (INDEPENDENT_AMBULATORY_CARE_PROVIDER_SITE_OTHER): Payer: Medicaid Other | Admitting: Family Medicine

## 2015-07-20 VITALS — BP 127/73 | HR 105 | Wt 198.6 lb

## 2015-07-20 DIAGNOSIS — O09213 Supervision of pregnancy with history of pre-term labor, third trimester: Secondary | ICD-10-CM

## 2015-07-20 DIAGNOSIS — O0990 Supervision of high risk pregnancy, unspecified, unspecified trimester: Secondary | ICD-10-CM

## 2015-07-20 DIAGNOSIS — O10913 Unspecified pre-existing hypertension complicating pregnancy, third trimester: Secondary | ICD-10-CM

## 2015-07-20 DIAGNOSIS — O10919 Unspecified pre-existing hypertension complicating pregnancy, unspecified trimester: Secondary | ICD-10-CM

## 2015-07-20 LAB — POCT URINALYSIS DIP (DEVICE)
BILIRUBIN URINE: NEGATIVE
GLUCOSE, UA: NEGATIVE mg/dL
Hgb urine dipstick: NEGATIVE
KETONES UR: NEGATIVE mg/dL
Nitrite: NEGATIVE
PROTEIN: 30 mg/dL — AB
Specific Gravity, Urine: 1.02 (ref 1.005–1.030)
Urobilinogen, UA: 1 mg/dL (ref 0.0–1.0)
pH: 6 (ref 5.0–8.0)

## 2015-07-20 NOTE — Patient Instructions (Signed)

## 2015-07-20 NOTE — Progress Notes (Signed)
Subjective:  Jasmine Baird is a 30 y.o. [phone] at [redacted]w[redacted]d being seen today for ongoing prenatal care.  She is currently monitored for the following issues for this high-risk pregnancy and has Supervision of high risk pregnancy, antepartum; Hx of preeclampsia, prior pregnancy, currently pregnant; Chronic hypertension in pregnancy; and Previous preterm delivery in third trimester, antepartum on her problem list.  Patient reports no complaints.  Contractions: Not present. Vag. Bleeding: None.  Movement: Present. Denies leaking of fluid.   The following portions of the patient's history were reviewed and updated as appropriate: allergies, current medications, past family history, past medical history, past social history, past surgical history and problem list. Problem list updated.  Objective:   Filed Vitals:   07/20/15 0821  BP: 127/73  Pulse: 105  Weight: 198 lb 9.6 oz (90.084 kg)    Fetal Status: Fetal Heart Rate (bpm): 140   Movement: Present     General:  Alert, oriented and cooperative. Patient is in no acute distress.  Skin: Skin is warm and dry. No rash noted.   Cardiovascular: Normal heart rate noted  Respiratory: Normal respiratory effort, no problems with respiration noted  Abdomen: Soft, gravid, appropriate for gestational age. Pain/Pressure: Absent     Pelvic: Vag. Bleeding: None     Cervical exam deferred        Extremities: Normal range of motion.  Edema: Trace  Mental Status: Normal mood and affect. Normal behavior. Normal judgment and thought content.   Urinalysis:      Assessment and Plan:  Pregnancy: [id number] at [redacted]w[redacted]d  1. Supervision of high risk pregnancy, antepartum, unspecified trimester FHT and Fh normal  2. Chronic hypertension in pregnancy, unspecified trimester Start NST twice weekly next appt.  Continue growth [place] q3-4 weeks.  3. Previous preterm delivery in third trimester, antepartum Continue makena  Preterm labor symptoms and general obstetric  precautions including but not limited to vaginal bleeding, contractions, leaking of fluid and fetal movement were reviewed in detail with the patient. Please refer to After Visit Summary for other counseling recommendations.  No Follow-up on file.   Truett Mainland, DO

## 2015-07-26 ENCOUNTER — Ambulatory Visit (INDEPENDENT_AMBULATORY_CARE_PROVIDER_SITE_OTHER): Payer: Medicaid Other | Admitting: Advanced Practice Midwife

## 2015-07-26 ENCOUNTER — Encounter: Payer: Self-pay | Admitting: Advanced Practice Midwife

## 2015-07-26 VITALS — BP 118/81 | HR 117

## 2015-07-26 DIAGNOSIS — O10913 Unspecified pre-existing hypertension complicating pregnancy, third trimester: Secondary | ICD-10-CM

## 2015-07-26 DIAGNOSIS — O1203 Gestational edema, third trimester: Secondary | ICD-10-CM | POA: Diagnosis not present

## 2015-07-26 LAB — POCT URINALYSIS DIP (DEVICE)
BILIRUBIN URINE: NEGATIVE
GLUCOSE, UA: NEGATIVE mg/dL
Hgb urine dipstick: NEGATIVE
NITRITE: NEGATIVE
Protein, ur: 30 mg/dL — AB
Specific Gravity, Urine: 1.025 (ref 1.005–1.030)
Urobilinogen, UA: 1 mg/dL (ref 0.0–1.0)
pH: 6.5 (ref 5.0–8.0)

## 2015-07-26 NOTE — Progress Notes (Signed)
Subjective:  Jasmine Baird is a 30 y.o. [phone] at [redacted]w[redacted]d being seen today for ongoing prenatal care.  She is currently monitored for the following issues for this high-risk pregnancy and has Supervision of high risk pregnancy, antepartum; Hx of preeclampsia, prior pregnancy, currently pregnant; Chronic hypertension in pregnancy; and Previous preterm delivery in third trimester, antepartum on her problem list.  Patient reports feet/ankle swelling that goes away with her feet up but returns every day.  Contractions: Not present. Vag. Bleeding: None.  Movement: Present. Denies leaking of fluid.   The following portions of the patient's history were reviewed and updated as appropriate: allergies, current medications, past family history, past medical history, past social history, past surgical history and problem list. Problem list updated.  Objective:   Filed Vitals:   07/26/15 0818  BP: 118/81  Pulse: 117    Fetal Status:     Movement: Present     General:  Alert, oriented and cooperative. Patient is in no acute distress.  Skin: Skin is warm and dry. No rash noted.   Cardiovascular: Normal heart rate noted  Respiratory: Normal respiratory effort, no problems with respiration noted  Abdomen: Soft, gravid, appropriate for gestational age. Pain/Pressure: Present     Pelvic: Vag. Bleeding: None     Cervical exam deferred        Extremities: Normal range of motion.  Edema: Trace  Mental Status: Normal mood and affect. Normal behavior. Normal judgment and thought content.   Urinalysis:      Assessment and Plan:  Pregnancy: [id number] at [redacted]w[redacted]d  1. Chronic hypertension in pregnancy, third trimester --BP wnl today - Fetal nonstress test  2. Edema during pregnancy, antepartum, third trimester -Rx for TED hose -Letter for work to put feet up while sitting at desk  Preterm labor symptoms and general obstetric precautions including but not limited to vaginal bleeding, contractions, leaking  of fluid and fetal movement were reviewed in detail with the patient. Please refer to After Visit Summary for other counseling recommendations.  Return for NST/AFI.   Elvera Maria, CNM

## 2015-07-28 ENCOUNTER — Ambulatory Visit (INDEPENDENT_AMBULATORY_CARE_PROVIDER_SITE_OTHER): Payer: Medicaid Other | Admitting: *Deleted

## 2015-07-28 VITALS — BP 142/81 | HR 108

## 2015-07-28 DIAGNOSIS — O10913 Unspecified pre-existing hypertension complicating pregnancy, third trimester: Secondary | ICD-10-CM

## 2015-07-28 DIAGNOSIS — O09213 Supervision of pregnancy with history of pre-term labor, third trimester: Secondary | ICD-10-CM

## 2015-07-28 DIAGNOSIS — Z36 Encounter for antenatal screening of mother: Secondary | ICD-10-CM

## 2015-07-28 NOTE — Progress Notes (Signed)
NST performed today was reviewed and was found to be reactive.  AFI was also normal.  Continue recommended antenatal testing and prenatal care.

## 2015-07-28 NOTE — Progress Notes (Signed)
Pt denies H/A or visual disturbances.  

## 2015-08-02 ENCOUNTER — Ambulatory Visit (INDEPENDENT_AMBULATORY_CARE_PROVIDER_SITE_OTHER): Payer: Medicaid Other | Admitting: Family

## 2015-08-02 ENCOUNTER — Ambulatory Visit (HOSPITAL_COMMUNITY)
Admission: RE | Admit: 2015-08-02 | Discharge: 2015-08-02 | Disposition: A | Discharge status: Home / Self Care | Payer: Medicaid Other | Source: Ambulatory Visit | Attending: Obstetrics & Gynecology | Admitting: Obstetrics & Gynecology

## 2015-08-02 VITALS — BP 135/83 | HR 109 | Wt 200.4 lb

## 2015-08-02 DIAGNOSIS — Z3A33 33 weeks gestation of pregnancy: Secondary | ICD-10-CM | POA: Diagnosis not present

## 2015-08-02 DIAGNOSIS — O09213 Supervision of pregnancy with history of pre-term labor, third trimester: Secondary | ICD-10-CM

## 2015-08-02 DIAGNOSIS — O10913 Unspecified pre-existing hypertension complicating pregnancy, third trimester: Secondary | ICD-10-CM

## 2015-08-02 DIAGNOSIS — O09293 Supervision of pregnancy with other poor reproductive or obstetric history, third trimester: Secondary | ICD-10-CM | POA: Diagnosis not present

## 2015-08-02 DIAGNOSIS — O3413 Maternal care for benign tumor of corpus uteri, third trimester: Secondary | ICD-10-CM | POA: Insufficient documentation

## 2015-08-02 DIAGNOSIS — D259 Leiomyoma of uterus, unspecified: Secondary | ICD-10-CM | POA: Diagnosis not present

## 2015-08-02 DIAGNOSIS — O0993 Supervision of high risk pregnancy, unspecified, third trimester: Secondary | ICD-10-CM

## 2015-08-02 DIAGNOSIS — O10013 Pre-existing essential hypertension complicating pregnancy, third trimester: Secondary | ICD-10-CM | POA: Diagnosis not present

## 2015-08-02 LAB — POCT URINALYSIS DIP (DEVICE)
Bilirubin Urine: NEGATIVE
Glucose, UA: NEGATIVE mg/dL
HGB URINE DIPSTICK: NEGATIVE
KETONES UR: NEGATIVE mg/dL
Leukocytes, UA: NEGATIVE
Nitrite: NEGATIVE
Protein, ur: NEGATIVE mg/dL
SPECIFIC GRAVITY, URINE: 1.015 (ref 1.005–1.030)
UROBILINOGEN UA: 0.2 mg/dL (ref 0.0–1.0)
pH: 6 (ref 5.0–8.0)

## 2015-08-02 NOTE — Progress Notes (Signed)
Subjective:  Jasmine Baird is a 30 y.o. [phone] at [redacted]w[redacted]d being seen today for ongoing prenatal care.  She is currently monitored for the following issues for this high-risk pregnancy and has Supervision of high risk pregnancy, antepartum; Hx of preeclampsia, prior pregnancy, currently pregnant; Chronic hypertension in pregnancy; and Previous preterm delivery in third trimester, antepartum on her problem list.  Patient reports no complaints.  Contractions: Not present. Vag. Bleeding: None.  Movement: Present. Denies leaking of fluid.   The following portions of the patient's history were reviewed and updated as appropriate: allergies, current medications, past family history, past medical history, past social history, past surgical history and problem list. Problem list updated.  Objective:   Filed Vitals:   08/02/15 0853  BP: 135/83  Pulse: 109  Weight: 200 lb 6.4 oz (90.901 kg)    Fetal Status: Fetal Heart Rate (bpm): NST-R Fundal Height: 34 cm Movement: Present     General:  Alert, oriented and cooperative. Patient is in no acute distress.  Skin: Skin is warm and dry. No rash noted.   Cardiovascular: Normal heart rate noted  Respiratory: Normal respiratory effort, no problems with respiration noted  Abdomen: Soft, gravid, appropriate for gestational age. Pain/Pressure: Present     Pelvic: Vag. Bleeding: None     Cervical exam deferred        Extremities: Normal range of motion.  Edema: Mild pitting, slight indentation  Mental Status: Normal mood and affect. Normal behavior. Normal judgment and thought content.   Urinalysis: Urine Protein: Negative Urine Glucose: Negative  Assessment and Plan:  Pregnancy: [id number] at [redacted]w[redacted]d  1. Chronic hypertension in pregnancy, third trimester - Fetal nonstress test > reactive - Growth ultrasound today  2.  Previous preterm delivery in third trimester, antepartum - 17-p today  Preterm labor symptoms and general obstetric precautions  including but not limited to vaginal bleeding, contractions, leaking of fluid and fetal movement were reviewed in detail with the patient. Please refer to After Visit Summary for other counseling recommendations.  Return in about 7 days (around 08/09/2015) for already has obfu/nst /bpp scheduled .   Venia Carbon Michiel Cowboy, CNM

## 2015-08-09 ENCOUNTER — Ambulatory Visit (HOSPITAL_COMMUNITY)
Admission: RE | Admit: 2015-08-09 | Discharge: 2015-08-09 | Disposition: A | Discharge status: Home / Self Care | Payer: Medicaid Other | Source: Ambulatory Visit | Attending: Obstetrics and Gynecology | Admitting: Obstetrics and Gynecology

## 2015-08-09 ENCOUNTER — Encounter (HOSPITAL_COMMUNITY): Payer: Self-pay

## 2015-08-09 ENCOUNTER — Other Ambulatory Visit: Payer: Self-pay | Admitting: Obstetrics & Gynecology

## 2015-08-09 ENCOUNTER — Ambulatory Visit (INDEPENDENT_AMBULATORY_CARE_PROVIDER_SITE_OTHER): Payer: Medicaid Other | Admitting: Student

## 2015-08-09 ENCOUNTER — Encounter: Payer: Self-pay | Admitting: Student

## 2015-08-09 VITALS — BP 122/87 | HR 93 | Wt 203.8 lb

## 2015-08-09 DIAGNOSIS — O10913 Unspecified pre-existing hypertension complicating pregnancy, third trimester: Secondary | ICD-10-CM

## 2015-08-09 DIAGNOSIS — D259 Leiomyoma of uterus, unspecified: Secondary | ICD-10-CM | POA: Diagnosis not present

## 2015-08-09 DIAGNOSIS — O09293 Supervision of pregnancy with other poor reproductive or obstetric history, third trimester: Secondary | ICD-10-CM | POA: Diagnosis present

## 2015-08-09 DIAGNOSIS — O09213 Supervision of pregnancy with history of pre-term labor, third trimester: Secondary | ICD-10-CM | POA: Diagnosis not present

## 2015-08-09 DIAGNOSIS — O10013 Pre-existing essential hypertension complicating pregnancy, third trimester: Secondary | ICD-10-CM | POA: Insufficient documentation

## 2015-08-09 DIAGNOSIS — Z3A34 34 weeks gestation of pregnancy: Secondary | ICD-10-CM

## 2015-08-09 DIAGNOSIS — O3413 Maternal care for benign tumor of corpus uteri, third trimester: Secondary | ICD-10-CM | POA: Diagnosis not present

## 2015-08-09 DIAGNOSIS — Z1389 Encounter for screening for other disorder: Secondary | ICD-10-CM

## 2015-08-09 DIAGNOSIS — Z36 Encounter for antenatal screening of mother: Secondary | ICD-10-CM | POA: Diagnosis not present

## 2015-08-09 LAB — POCT URINALYSIS DIP (DEVICE)
BILIRUBIN URINE: NEGATIVE
GLUCOSE, UA: NEGATIVE mg/dL
KETONES UR: NEGATIVE mg/dL
NITRITE: NEGATIVE
Protein, ur: NEGATIVE mg/dL
Specific Gravity, Urine: 1.025 (ref 1.005–1.030)
Urobilinogen, UA: 0.2 mg/dL (ref 0.0–1.0)
pH: 6 (ref 5.0–8.0)

## 2015-08-09 NOTE — Patient Instructions (Signed)
Preterm Labor Information °Preterm labor is when labor starts at less than 37 weeks of pregnancy. The normal length of a pregnancy is 39 to 41 weeks. °CAUSES °Often, there is no identifiable underlying cause as to why a woman goes into preterm labor. One of the most common known causes of preterm labor is infection. Infections of the uterus, cervix, vagina, amniotic sac, bladder, kidney, or even the lungs (pneumonia) can cause labor to start. Other suspected causes of preterm labor include:  °· Urogenital infections, such as yeast infections and bacterial vaginosis.   °· Uterine abnormalities (uterine shape, uterine septum, fibroids, or bleeding from the placenta).   °· A cervix that has been operated on (it may fail to stay closed).   °· Malformations in the fetus.   °· Multiple gestations (twins, triplets, and so on).   °· Breakage of the amniotic sac.   °RISK FACTORS °· Having a previous history of preterm labor.   °· Having premature rupture of membranes (PROM).   °· Having a placenta that covers the opening of the cervix (placenta previa).   °· Having a placenta that separates from the uterus (placental abruption).   °· Having a cervix that is too weak to hold the fetus in the uterus (incompetent cervix).   °· Having too much fluid in the amniotic sac (polyhydramnios).   °· Taking illegal drugs or smoking while pregnant.   °· Not gaining enough weight while pregnant.   °· Being younger than 18 and older than 31 years old.   °· Having a low socioeconomic status.   °· Being African American. °SYMPTOMS °Signs and symptoms of preterm labor include:  °· Menstrual-like cramps, abdominal pain, or back pain. °· Uterine contractions that are regular, as frequent as six in an hour, regardless of their intensity (may be mild or painful). °· Contractions that start on the top of the uterus and spread down to the lower abdomen and back.   °· A sense of increased pelvic pressure.   °· A watery or bloody mucus discharge that  comes from the vagina.   °TREATMENT °Depending on the length of the pregnancy and other circumstances, your health care provider may suggest bed rest. If necessary, there are medicines that can be given to stop contractions and to mature the fetal lungs. If labor happens before 34 weeks of pregnancy, a prolonged hospital stay may be recommended. Treatment depends on the condition of both you and the fetus.  °WHAT SHOULD YOU DO IF YOU THINK YOU ARE IN PRETERM LABOR? °Call your health care provider right away. You will need to go to the hospital to get checked immediately. °HOW CAN YOU PREVENT PRETERM LABOR IN FUTURE PREGNANCIES? °You should:  °· Stop smoking if you smoke.  °· Maintain healthy weight gain and avoid chemicals and drugs that are not necessary. °· Be watchful for any type of infection. °· Inform your health care provider if you have a known history of preterm labor. °  °This information is not intended to replace advice given to you by your health care provider. Make sure you discuss any questions you have with your health care provider. °  °Document Released: 06/15/2003 Document Revised: 11/25/2012 Document Reviewed: 04/27/2012 °Elsevier Interactive Patient Education ©2016 [employer] ° ° ° °Hypertension During Pregnancy °Hypertension, or high blood pressure, is when there is extra pressure inside your blood vessels that carry blood from the heart to the rest of your body (arteries). It can happen at any time in life, including pregnancy. Hypertension during pregnancy can cause problems for you and your baby. Your baby might not weigh   as much as he or she should at birth or might be born early (premature). Very bad cases of hypertension during pregnancy can be life-threatening.  °Different types of hypertension can occur during pregnancy. These include: °· Chronic hypertension. This happens when a woman has hypertension before pregnancy and it continues during pregnancy. °· Gestational hypertension.  This is when hypertension develops during pregnancy. °· Preeclampsia or toxemia of pregnancy. This is a very serious type of hypertension that develops only during pregnancy. It affects the whole body and can be very dangerous for both mother and baby.   °Gestational hypertension and preeclampsia usually go away after your baby is born. Your blood pressure will likely stabilize within 6 weeks. Women who have hypertension during pregnancy have a greater chance of developing hypertension later in life or with future pregnancies. °RISK FACTORS °There are certain factors that make it more likely for you to develop hypertension during pregnancy. These include: °· Having hypertension before pregnancy. °· Having hypertension during a previous pregnancy. °· Being overweight. °· Being older than 40 years. °· Being pregnant with more than one baby. °· Having diabetes or kidney problems. °SIGNS AND SYMPTOMS °Chronic and gestational hypertension rarely cause symptoms. Preeclampsia has symptoms, which may include: °· Increased protein in your urine. Your health care provider will check for this at every prenatal visit. °· Swelling of your hands and face. °· Rapid weight gain. °· Headaches. °· Visual changes. °· Being bothered by light. °· Abdominal pain, especially in the upper right area. °· Chest pain. °· Shortness of breath. °· Increased reflexes. °· Seizures. These occur with a more severe form of preeclampsia, called eclampsia. °DIAGNOSIS  °You may be diagnosed with hypertension during a regular prenatal exam. At each prenatal visit, you may have: °· Your blood pressure checked. °· A urine test to check for protein in your urine. °The type of hypertension you are diagnosed with depends on when you developed it. It also depends on your specific blood pressure reading. °· Developing hypertension before 20 weeks of pregnancy is consistent with chronic hypertension. °· Developing hypertension after 20 weeks of pregnancy is  consistent with gestational hypertension. °· Hypertension with increased urinary protein is diagnosed as preeclampsia. °· Blood pressure measurements that stay above 160 systolic or 110 diastolic are a sign of severe preeclampsia. °TREATMENT °Treatment for hypertension during pregnancy varies. Treatment depends on the type of hypertension and how serious it is. °· If you take medicine for chronic hypertension, you may need to switch medicines. °¨ Medicines called ACE inhibitors should not be taken during pregnancy. °¨ Low-dose aspirin may be suggested for women who have risk factors for preeclampsia. °· If you have gestational hypertension, you may need to take a blood pressure medicine that is safe during pregnancy. Your health care provider will recommend the correct medicine. °· If you have severe preeclampsia, you may need to be in the hospital. Health care providers will watch you and your baby very closely. You also may need to take medicine called magnesium sulfate to prevent seizures and lower blood pressure. °· Sometimes, an early delivery is needed. This may be the case if the condition worsens. It would be done to protect you and your baby. The only cure for preeclampsia is delivery. °· Your health care provider may recommend that you take one low-dose aspirin (81 mg) each day to help prevent high blood pressure during your pregnancy if you are at risk for preeclampsia. You may be at risk for preeclampsia if: °¨   You had preeclampsia or eclampsia during a previous pregnancy. °¨ Your baby did not grow as expected during a previous pregnancy. °¨ You experienced preterm birth with a previous pregnancy. °¨ You experienced a separation of the placenta from the uterus (placental abruption) during a previous pregnancy. °¨ You experienced the loss of your baby during a previous pregnancy. °¨ You are pregnant with more than one baby. °¨ You have other medical conditions, such as diabetes or an autoimmune  disease. °HOME CARE INSTRUCTIONS °· Schedule and keep all of your regular prenatal care appointments. This is important. °· Take medicines only as directed by your health care provider. Tell your health care provider about all medicines you take. °· Eat as little salt as possible. °· Get regular exercise. °· Do not drink alcohol. °· Do not use tobacco products. °· Do not drink products with caffeine. °· Lie on your left side when resting. °SEEK IMMEDIATE MEDICAL CARE IF: °· You have severe abdominal pain. °· You have sudden swelling in your hands, ankles, or face. °· You gain 4 pounds (1.8 kg) or more in 1 week. °· You vomit repeatedly. °· You have vaginal bleeding. °· You do not feel your baby moving as much. °· You have a headache. °· You have blurred or double vision. °· You have muscle twitching or spasms. °· You have shortness of breath. °· You have blue fingernails or lips. °· You have blood in your urine. °MAKE SURE YOU: °· Understand these instructions. °· Will watch your condition. °· Will get help right away if you are not doing well or get worse. °  °This information is not intended to replace advice given to you by your health care provider. Make sure you discuss any questions you have with your health care provider. °  °Document Released: 12/11/2010 Document Revised: 04/15/2014 Document Reviewed: 10/22/2012 °Elsevier Interactive Patient Education ©2016 [employer] ° °

## 2015-08-09 NOTE — Progress Notes (Signed)
Breastfeeding tip of the week reviewed 17-p today

## 2015-08-09 NOTE — Progress Notes (Signed)
Subjective:  Jasmine Baird is a 30 y.o. [phone] at [redacted]w[redacted]d being seen today for ongoing prenatal care.  She is currently monitored for the following issues for this high-risk pregnancy and has Supervision of high risk pregnancy, antepartum; Hx of preeclampsia, prior pregnancy, currently pregnant; Chronic hypertension in pregnancy; and Previous preterm delivery in third trimester, antepartum on her problem list.  Patient reports no complaints.  Contractions: Not present. Vag. Bleeding: None.  Movement: Present. Denies leaking of fluid.   The following portions of the patient's history were reviewed and updated as appropriate: allergies, current medications, past family history, past medical history, past social history, past surgical history and problem list. Problem list updated.  Objective:   Filed Vitals:   08/09/15 0856  BP: 122/87  Pulse: 93  Weight: 203 lb 12.8 oz (92.443 kg)    Fetal Status: Fetal Heart Rate (bpm): nst   Movement: Present     General:  Alert, oriented and cooperative. Patient is in no acute distress.  Skin: Skin is warm and dry. No rash noted.   Cardiovascular: Normal heart rate noted  Respiratory: Normal respiratory effort, no problems with respiration noted  Abdomen: Soft, gravid, appropriate for gestational age. Pain/Pressure: Present     Pelvic: Vag. Bleeding: None     Cervical exam deferred        Extremities: Normal range of motion.  Edema: Moderate pitting, indentation subsides rapidly  Mental Status: Normal mood and affect. Normal behavior. Normal judgment and thought content.   Urinalysis: Urine Protein: Negative Urine Glucose: Negative  Assessment and Plan:  Pregnancy: [id number] at [redacted]w[redacted]d  1. Previous preterm delivery in third trimester, antepartum -17P injection today  2. Chronic hypertension in pregnancy, third trimester -reactive NST -BPP today  Preterm labor symptoms and general obstetric precautions including but not limited to vaginal  bleeding, contractions, leaking of fluid and fetal movement were reviewed in detail with the patient. Please refer to After Visit Summary for other counseling recommendations.  Return in about 1 week (around 08/16/2015) for twice weekly antenatal testing.   Jorje Guild, NP

## 2015-08-15 ENCOUNTER — Ambulatory Visit (INDEPENDENT_AMBULATORY_CARE_PROVIDER_SITE_OTHER): Payer: Medicaid Other | Admitting: Advanced Practice Midwife

## 2015-08-15 VITALS — BP 126/84 | HR 94 | Wt 202.8 lb

## 2015-08-15 DIAGNOSIS — Z36 Encounter for antenatal screening of mother: Secondary | ICD-10-CM | POA: Diagnosis not present

## 2015-08-15 DIAGNOSIS — O0993 Supervision of high risk pregnancy, unspecified, third trimester: Secondary | ICD-10-CM

## 2015-08-15 DIAGNOSIS — O09213 Supervision of pregnancy with history of pre-term labor, third trimester: Secondary | ICD-10-CM

## 2015-08-15 DIAGNOSIS — O10913 Unspecified pre-existing hypertension complicating pregnancy, third trimester: Secondary | ICD-10-CM

## 2015-08-15 LAB — POCT URINALYSIS DIP (DEVICE)
Bilirubin Urine: NEGATIVE
Glucose, UA: NEGATIVE mg/dL
Ketones, ur: NEGATIVE mg/dL
NITRITE: NEGATIVE
PH: 7 (ref 5.0–8.0)
PROTEIN: NEGATIVE mg/dL
Specific Gravity, Urine: 1.015 (ref 1.005–1.030)
UROBILINOGEN UA: 0.2 mg/dL (ref 0.0–1.0)

## 2015-08-15 NOTE — Progress Notes (Signed)
Subjective:  Jasmine Baird is a 30 y.o. [phone] at [redacted]w[redacted]d being seen today for ongoing prenatal care.  She is currently monitored for the following issues for this high-risk pregnancy and has Supervision of high risk pregnancy, antepartum; Hx of preeclampsia, prior pregnancy, currently pregnant; Chronic hypertension in pregnancy; and Previous preterm delivery in third trimester, antepartum on her problem list.  Patient reports no complaints.  Contractions: Not present. Vag. Bleeding: None.  Movement: Present. Denies leaking of fluid.   The following portions of the patient's history were reviewed and updated as appropriate: allergies, current medications, past family history, past medical history, past social history, past surgical history and problem list. Problem list updated.  Objective:   Filed Vitals:   08/15/15 0821  BP: 126/84  Pulse: 94  Weight: 202 lb 12.8 oz (91.989 kg)    Fetal Status: Fetal Heart Rate (bpm): NST-R   Movement: Present  Presentation: Vertex  General:  Alert, oriented and cooperative. Patient is in no acute distress.  Skin: Skin is warm and dry. No rash noted.   Cardiovascular: Normal heart rate noted  Respiratory: Normal respiratory effort, no problems with respiration noted  Abdomen: Soft, gravid, appropriate for gestational age. Pain/Pressure: Present     Pelvic: Vag. Bleeding: None     Cervical exam deferred        Extremities: Normal range of motion.  Edema: Mild pitting, slight indentation  Mental Status: Normal mood and affect. Normal behavior. Normal judgment and thought content.   Urinalysis: Urine Protein: Negative Urine Glucose: Negative  Assessment and Plan:  Pregnancy: [id number] at [redacted]w[redacted]d  1. Chronic hypertension in pregnancy, third trimester --Twice weekly testing.   --NST reactive today - Amniotic fluid index with NST  2. Supervision of high risk pregnancy, antepartum, third trimester   Preterm labor symptoms and general obstetric  precautions including but not limited to vaginal bleeding, contractions, leaking of fluid and fetal movement were reviewed in detail with the patient. Please refer to After Visit Summary for other counseling recommendations.  Return for 2x/wk as scheduled.   Elvera Maria, CNM

## 2015-08-18 ENCOUNTER — Ambulatory Visit (INDEPENDENT_AMBULATORY_CARE_PROVIDER_SITE_OTHER): Payer: Medicaid Other | Admitting: *Deleted

## 2015-08-18 VITALS — BP 130/86 | HR 106

## 2015-08-18 DIAGNOSIS — O10913 Unspecified pre-existing hypertension complicating pregnancy, third trimester: Secondary | ICD-10-CM | POA: Diagnosis not present

## 2015-08-18 NOTE — Progress Notes (Signed)
NST today reactive

## 2015-08-22 ENCOUNTER — Other Ambulatory Visit (HOSPITAL_COMMUNITY)
Admission: RE | Admit: 2015-08-22 | Discharge: 2015-08-22 | Disposition: A | Discharge status: Home / Self Care | Payer: Medicaid Other | Source: Ambulatory Visit | Attending: Obstetrics & Gynecology | Admitting: Obstetrics & Gynecology

## 2015-08-22 ENCOUNTER — Ambulatory Visit (INDEPENDENT_AMBULATORY_CARE_PROVIDER_SITE_OTHER): Payer: Medicaid Other | Admitting: Student

## 2015-08-22 VITALS — BP 127/83 | HR 99 | Wt 204.8 lb

## 2015-08-22 DIAGNOSIS — O09213 Supervision of pregnancy with history of pre-term labor, third trimester: Secondary | ICD-10-CM

## 2015-08-22 DIAGNOSIS — Z36 Encounter for antenatal screening of mother: Secondary | ICD-10-CM | POA: Diagnosis not present

## 2015-08-22 DIAGNOSIS — O10913 Unspecified pre-existing hypertension complicating pregnancy, third trimester: Secondary | ICD-10-CM

## 2015-08-22 DIAGNOSIS — Z113 Encounter for screening for infections with a predominantly sexual mode of transmission: Secondary | ICD-10-CM | POA: Diagnosis not present

## 2015-08-22 DIAGNOSIS — O0993 Supervision of high risk pregnancy, unspecified, third trimester: Secondary | ICD-10-CM

## 2015-08-22 LAB — POCT URINALYSIS DIP (DEVICE)
Bilirubin Urine: NEGATIVE
Glucose, UA: NEGATIVE mg/dL
Ketones, ur: NEGATIVE mg/dL
NITRITE: NEGATIVE
PH: 6 (ref 5.0–8.0)
PROTEIN: NEGATIVE mg/dL
SPECIFIC GRAVITY, URINE: 1.015 (ref 1.005–1.030)
UROBILINOGEN UA: 0.2 mg/dL (ref 0.0–1.0)

## 2015-08-22 LAB — OB RESULTS CONSOLE GC/CHLAMYDIA: GC PROBE AMP, GENITAL: NEGATIVE

## 2015-08-22 LAB — OB RESULTS CONSOLE GBS: GBS: NEGATIVE

## 2015-08-22 NOTE — Progress Notes (Signed)
  Subjective:  Jasmine Baird is a 30 y.o. [phone] at [redacted]w[redacted]d being seen today for ongoing prenatal care.  She is currently monitored for the following issues for this high-risk pregnancy and has Supervision of high risk pregnancy, antepartum; Hx of preeclampsia, prior pregnancy, currently pregnant; Chronic hypertension in pregnancy; and Previous preterm delivery in third trimester, antepartum on her problem list.  Patient reports no complaints.  Contractions: Irregular. Vag. Bleeding: None.  Movement: Present. Denies leaking of fluid.   The following portions of the patient's history were reviewed and updated as appropriate: allergies, current medications, past family history, past medical history, past social history, past surgical history and problem list. Problem list updated.  Objective:   Filed Vitals:   08/22/15 0913  BP: 127/83  Pulse: 99  Weight: 204 lb 12.8 oz (92.897 kg)    Fetal Status: Fetal Heart Rate (bpm): NST Fundal Height: 37 cm Movement: Present  Presentation: Vertex   Reactive NST  General:  Alert, oriented and cooperative. Patient is in no acute distress.  Skin: Skin is warm and dry. No rash noted.   Cardiovascular: Normal heart rate noted  Respiratory: Normal respiratory effort, no problems with respiration noted  Abdomen: Soft, gravid, appropriate for gestational age. Pain/Pressure: Present     Pelvic: Vag. Bleeding: None     Cervical exam performed Dilation: Fingertip Effacement (%): Thick Station: -3  Extremities: Normal range of motion.     Mental Status: Normal mood and affect. Normal behavior. Normal judgment and thought content.   Urinalysis: Urine Protein: Negative Urine Glucose: Negative  Assessment and Plan:  Pregnancy: [id number] at [redacted]w[redacted]d  1. Chronic hypertension in pregnancy, third trimester  - Amniotic fluid index with NST - Culture, beta strep (group b only) - GC/Chlamydia probe amp ([place])not at [facility]  2. Supervision of high risk  pregnancy, antepartum, third trimester   3. Previous preterm delivery in third trimester, antepartum   Preterm labor symptoms and general obstetric precautions including but not limited to vaginal bleeding, contractions, leaking of fluid and fetal movement were reviewed in detail with the patient. Please refer to After Visit Summary for other counseling recommendations.  Return in about 1 week (around 08/29/2015) for Routine OB, twice weekly antenatal testing.   Jorje Guild, NP

## 2015-08-22 NOTE — Patient Instructions (Signed)

## 2015-08-23 LAB — GC/CHLAMYDIA PROBE AMP (~~LOC~~) NOT AT ARMC
CHLAMYDIA, DNA PROBE: NEGATIVE
NEISSERIA GONORRHEA: NEGATIVE

## 2015-08-24 LAB — CULTURE, BETA STREP (GROUP B ONLY)

## 2015-08-25 ENCOUNTER — Ambulatory Visit (INDEPENDENT_AMBULATORY_CARE_PROVIDER_SITE_OTHER): Payer: Medicaid Other | Admitting: *Deleted

## 2015-08-25 DIAGNOSIS — O10913 Unspecified pre-existing hypertension complicating pregnancy, third trimester: Secondary | ICD-10-CM | POA: Diagnosis not present

## 2015-08-25 NOTE — Progress Notes (Signed)
NST reviewed and reactive.  

## 2015-08-29 ENCOUNTER — Ambulatory Visit (INDEPENDENT_AMBULATORY_CARE_PROVIDER_SITE_OTHER): Payer: Medicaid Other | Admitting: Medical

## 2015-08-29 VITALS — BP 130/87 | HR 103

## 2015-08-29 DIAGNOSIS — O10913 Unspecified pre-existing hypertension complicating pregnancy, third trimester: Secondary | ICD-10-CM

## 2015-08-29 DIAGNOSIS — Z36 Encounter for antenatal screening of mother: Secondary | ICD-10-CM

## 2015-08-29 LAB — POCT URINALYSIS DIP (DEVICE)
BILIRUBIN URINE: NEGATIVE
GLUCOSE, UA: NEGATIVE mg/dL
HGB URINE DIPSTICK: NEGATIVE
Ketones, ur: NEGATIVE mg/dL
NITRITE: NEGATIVE
Protein, ur: NEGATIVE mg/dL
SPECIFIC GRAVITY, URINE: 1.015 (ref 1.005–1.030)
UROBILINOGEN UA: 0.2 mg/dL (ref 0.0–1.0)
pH: 6 (ref 5.0–8.0)

## 2015-08-29 NOTE — Patient Instructions (Addendum)
Braxton Hicks Contractions Contractions of the uterus can occur throughout pregnancy. Contractions are not always a sign that you are in labor.  WHAT ARE BRAXTON HICKS CONTRACTIONS?  Contractions that occur before labor are called Braxton Hicks contractions, or false labor. Toward the end of pregnancy (32-34 weeks), these contractions can develop more often and may become more forceful. This is not true labor because these contractions do not result in opening (dilatation) and thinning of the cervix. They are sometimes difficult to tell apart from true labor because these contractions can be forceful and people have different pain tolerances. You should not feel embarrassed if you go to the hospital with false labor. Sometimes, the only way to tell if you are in true labor is for your health care provider to look for changes in the cervix. If there are no prenatal problems or other health problems associated with the pregnancy, it is completely safe to be sent home with false labor and await the onset of true labor. HOW CAN YOU TELL THE DIFFERENCE BETWEEN TRUE AND FALSE LABOR? False Labor  The contractions of false labor are usually shorter and not as hard as those of true labor.   The contractions are usually irregular.   The contractions are often felt in the front of the lower abdomen and in the groin.   The contractions may go away when you walk around or change positions while lying down.   The contractions get weaker and are shorter lasting as time goes on.   The contractions do not usually become progressively stronger, regular, and closer together as with true labor.  True Labor  Contractions in true labor last 30-70 seconds, become very regular, usually become more intense, and increase in frequency.   The contractions do not go away with walking.   The discomfort is usually felt in the top of the uterus and spreads to the lower abdomen and low back.   True labor can be  determined by your health care provider with an exam. This will show that the cervix is dilating and getting thinner.  WHAT TO REMEMBER  Keep up with your usual exercises and follow other instructions given by your health care provider.   Take medicines as directed by your health care provider.   Keep your regular prenatal appointments.   Eat and drink lightly if you think you are going into labor.   If Braxton Hicks contractions are making you uncomfortable:   Change your position from lying down or resting to walking, or from walking to resting.   Sit and rest in a tub of warm water.   Drink 2-3 glasses of water. Dehydration may cause these contractions.   Do slow and deep breathing several times an hour.  WHEN SHOULD I SEEK IMMEDIATE MEDICAL CARE? Seek immediate medical care if:  Your contractions become stronger, more regular, and closer together.   You have fluid leaking or gushing from your vagina.   You have a fever.   You pass blood-tinged mucus.   You have vaginal bleeding.   You have continuous abdominal pain.   You have low back pain that you never had before.   You feel your baby's head pushing down and causing pelvic pressure.   Your baby is not moving as much as it used to.    This information is not intended to replace advice given to you by your health care provider. Make sure you discuss any questions you have with your health care  provider. °  °Document Released: 03/25/2005 Document Revised: 03/30/2013 Document Reviewed: 01/04/2013 °Elsevier Interactive Patient Education ©2016 Elsevier [employer] °Preeclampsia and Eclampsia °Preeclampsia is a serious condition that develops only during pregnancy. It is also called toxemia of pregnancy. This condition causes high blood pressure along with other symptoms, such as swelling and headaches. These may develop as the condition gets worse. Preeclampsia may occur 20 weeks or later into your pregnancy.    °Diagnosing and treating preeclampsia early is very important. If not treated early, it can cause serious problems for you and your baby. One problem it can lead to is eclampsia, which is a condition that causes muscle jerking or shaking (convulsions) in the mother. Delivering your baby is the best treatment for preeclampsia or eclampsia.  °RISK FACTORS °The cause of preeclampsia is not known. You may be more likely to develop preeclampsia if you have certain risk factors. These include:  °· Being pregnant for the first time. °· Having preeclampsia in a past pregnancy. °· Having a family history of preeclampsia. °· Having high blood pressure. °· Being pregnant with twins or triplets. °· Being 35 or older. °· Being African American. °· Having kidney disease or diabetes. °· Having medical conditions such as lupus or blood diseases. °· Being very overweight (obese). °SIGNS AND SYMPTOMS  °The earliest signs of preeclampsia are: °· High blood pressure. °· Increased protein in your urine. Your health care provider will check for this at every prenatal visit. °Other symptoms that can develop include:  °· Severe headaches. °· Sudden weight gain. °· Swelling of your hands, face, legs, and feet. °· Feeling sick to your stomach (nauseous) and throwing up (vomiting). °· Vision problems (blurred or double vision). °· Numbness in your face, arms, legs, and feet. °· Dizziness. °· Slurred speech. °· Sensitivity to bright lights. °· Abdominal pain. °DIAGNOSIS  °There are no screening tests for preeclampsia. Your health care provider will ask you about symptoms and check for signs of preeclampsia during your prenatal visits. You may also have tests, including: °· Urine testing. °· Blood testing. °· Checking your baby's heart rate. °· Checking the health of your baby and your placenta using images created with sound waves (ultrasound). °TREATMENT  °You can work out the best treatment approach together with your health care provider.  It is very important to keep all prenatal appointments. If you have an increased risk of preeclampsia, you may need more frequent prenatal exams. °· Your health care provider may prescribe bed rest. °· You may have to eat as little salt as possible. °· You may need to take medicine to lower your blood pressure if the condition does not respond to more conservative measures. °· You may need to stay in the hospital if your condition is severe. There, treatment will focus on controlling your blood pressure and fluid retention. You may also need to take medicine to prevent seizures. °· If the condition gets worse, your baby may need to be delivered early to protect you and the baby. You may have your labor started with medicine (be induced), or you may have a cesarean delivery. °· Preeclampsia usually goes away after the baby is born. °HOME CARE INSTRUCTIONS  °· Only take over-the-counter or prescription medicines as directed by your health care provider. °· Lie on your left side while resting. This keeps pressure off your baby. °· Elevate your feet while resting. °· Get regular exercise. Ask your health care provider what type of exercise is safe for you. °·   caffeine and alcohol.  Do not smoke.  Drink 6-8 glasses of water every day.  Eat a balanced diet that is low in salt. Do not add salt to your food.  Avoid stressful situations as much as possible.  Get plenty of rest and sleep.  Keep all prenatal appointments and tests as scheduled. SEEK MEDICAL CARE IF:  You are gaining more weight than expected.  You have any headaches, abdominal pain, or nausea.  You are bruising more than usual.  You feel dizzy or light-headed. SEEK IMMEDIATE MEDICAL CARE IF:   You develop sudden or severe swelling anywhere in your body. This usually happens in the legs.  You gain 5 lb (2.3 kg) or more in a week.  You have a severe headache, dizziness, problems with your vision, or confusion.  You have severe  abdominal pain.  You have lasting nausea or vomiting.  You have a seizure.  You have trouble moving any part of your body.  You develop numbness in your body.  You have trouble speaking.  You have any abnormal bleeding.  You develop a stiff neck.  You pass out. MAKE SURE YOU:   Understand these instructions.  Will watch your condition.  Will get help right away if you are not doing well or get worse.   This information is not intended to replace advice given to you by your health care provider. Make sure you discuss any questions you have with your health care provider.   Document Released: 03/22/2000 Document Revised: 03/30/2013 Document Reviewed: 01/15/2013 Elsevier Interactive Patient Education [employer].

## 2015-08-29 NOTE — Addendum Note (Signed)
Addended by: Langston Reusing on: 08/29/2015 03:30 PM   Modules accepted: Orders

## 2015-08-29 NOTE — Progress Notes (Signed)
Subjective:  Jasmine Baird is a 30 y.o. [phone] at [redacted]w[redacted]d being seen today for ongoing prenatal care.  She is currently monitored for the following issues for this high-risk pregnancy and has Supervision of high risk pregnancy, antepartum; Hx of preeclampsia, prior pregnancy, currently pregnant; Chronic hypertension in pregnancy; and Previous preterm delivery in third trimester, antepartum on her problem list.  Patient reports no complaints.  Contractions: Irregular. Vag. Bleeding: None.  Movement: Present. Denies leaking of fluid.   The following portions of the patient's history were reviewed and updated as appropriate: allergies, current medications, past family history, past medical history, past social history, past surgical history and problem list. Problem list updated.  Objective:   Filed Vitals:   08/29/15 0815  BP: 126/93  Pulse: 103    Fetal Status: Fetal Heart Rate (bpm): NST Fundal Height: 37 cm Movement: Present     General:  Alert, oriented and cooperative. Patient is in no acute distress.  Skin: Skin is warm and dry. No rash noted.   Cardiovascular: Normal heart rate noted  Respiratory: Normal respiratory effort, no problems with respiration noted  Abdomen: Soft, gravid, appropriate for gestational age. Pain/Pressure: Present     Pelvic: Vag. Bleeding: None     Cervical exam deferred        Extremities: Normal range of motion.  Edema: Mild pitting, slight indentation  Mental Status: Normal mood and affect. Normal behavior. Normal judgment and thought content.   Urinalysis:     Urine Protein: negative, Urine Glucose: negative  Assessment and Plan:  Pregnancy: [id number] at [redacted]w[redacted]d  1. Chronic hypertension in pregnancy, third trimester - Patient declines cervical exam - BP today mild elevation noted initially without proteinuria today, recheck - normal BP - NST today is reactive - 2x/week NST, next scheduled for Thursday   Term labor symptoms and general obstetric  precautions including but not limited to vaginal bleeding, contractions, leaking of fluid and fetal movement were reviewed in detail with the patient. Please refer to After Visit Summary for other counseling recommendations.  Return in about 3 days (around 09/01/2015) for 2x/wk as scheduled.   Luvenia Redden, PA-C

## 2015-08-29 NOTE — Progress Notes (Signed)
Pt reports occasional slight headaches - none today. She denies visual disturbances.

## 2015-08-30 ENCOUNTER — Telehealth: Payer: Self-pay

## 2015-08-30 NOTE — Telephone Encounter (Signed)
Pt called stating she is having a lot of lower back pain that comes and goes. This started today but since calling she is better but wanted to make sure she did not need to be seen any sooner. I advised patient to wait it out and if the pain get to be unbearable she should call us back or go MAU. Pt is schedule for her NST on 09/01/2015.

## 2015-09-01 ENCOUNTER — Ambulatory Visit (INDEPENDENT_AMBULATORY_CARE_PROVIDER_SITE_OTHER): Payer: Medicaid Other | Admitting: *Deleted

## 2015-09-01 VITALS — BP 139/85 | HR 91

## 2015-09-01 DIAGNOSIS — O10913 Unspecified pre-existing hypertension complicating pregnancy, third trimester: Secondary | ICD-10-CM

## 2015-09-01 NOTE — Progress Notes (Signed)
Pt denies H/A or visual disturbances.  

## 2015-09-01 NOTE — Progress Notes (Signed)
NST reviewed and reactive.  

## 2015-09-06 ENCOUNTER — Encounter (HOSPITAL_COMMUNITY): Payer: Self-pay

## 2015-09-06 ENCOUNTER — Inpatient Hospital Stay (HOSPITAL_COMMUNITY)
Admission: AD | Admit: 2015-09-06 | Discharge: 2015-09-09 | DRG: 775 | Disposition: A | Discharge status: Home / Self Care | Payer: Medicaid Other | Source: Ambulatory Visit | Attending: Obstetrics & Gynecology | Admitting: Obstetrics & Gynecology

## 2015-09-06 ENCOUNTER — Ambulatory Visit (INDEPENDENT_AMBULATORY_CARE_PROVIDER_SITE_OTHER): Payer: Medicaid Other | Admitting: Family

## 2015-09-06 VITALS — BP 147/92 | HR 90 | Wt 206.6 lb

## 2015-09-06 DIAGNOSIS — O0992 Supervision of high risk pregnancy, unspecified, second trimester: Secondary | ICD-10-CM

## 2015-09-06 DIAGNOSIS — O09213 Supervision of pregnancy with history of pre-term labor, third trimester: Secondary | ICD-10-CM

## 2015-09-06 DIAGNOSIS — Z6838 Body mass index (BMI) 38.0-38.9, adult: Secondary | ICD-10-CM | POA: Diagnosis not present

## 2015-09-06 DIAGNOSIS — O09292 Supervision of pregnancy with other poor reproductive or obstetric history, second trimester: Secondary | ICD-10-CM

## 2015-09-06 DIAGNOSIS — O162 Unspecified maternal hypertension, second trimester: Secondary | ICD-10-CM

## 2015-09-06 DIAGNOSIS — O10913 Unspecified pre-existing hypertension complicating pregnancy, third trimester: Secondary | ICD-10-CM

## 2015-09-06 DIAGNOSIS — O9912 Other diseases of the blood and blood-forming organs and certain disorders involving the immune mechanism complicating childbirth: Secondary | ICD-10-CM | POA: Diagnosis present

## 2015-09-06 DIAGNOSIS — O0993 Supervision of high risk pregnancy, unspecified, third trimester: Secondary | ICD-10-CM

## 2015-09-06 DIAGNOSIS — Z3A38 38 weeks gestation of pregnancy: Secondary | ICD-10-CM

## 2015-09-06 DIAGNOSIS — O99214 Obesity complicating childbirth: Secondary | ICD-10-CM | POA: Diagnosis present

## 2015-09-06 DIAGNOSIS — O114 Pre-existing hypertension with pre-eclampsia, complicating childbirth: Principal | ICD-10-CM | POA: Diagnosis present

## 2015-09-06 DIAGNOSIS — O10919 Unspecified pre-existing hypertension complicating pregnancy, unspecified trimester: Secondary | ICD-10-CM | POA: Diagnosis present

## 2015-09-06 DIAGNOSIS — O09299 Supervision of pregnancy with other poor reproductive or obstetric history, unspecified trimester: Secondary | ICD-10-CM

## 2015-09-06 DIAGNOSIS — O0990 Supervision of high risk pregnancy, unspecified, unspecified trimester: Secondary | ICD-10-CM

## 2015-09-06 DIAGNOSIS — Z36 Encounter for antenatal screening of mother: Secondary | ICD-10-CM | POA: Diagnosis not present

## 2015-09-06 DIAGNOSIS — O1092 Unspecified pre-existing hypertension complicating childbirth: Secondary | ICD-10-CM | POA: Diagnosis not present

## 2015-09-06 DIAGNOSIS — O119 Pre-existing hypertension with pre-eclampsia, unspecified trimester: Secondary | ICD-10-CM | POA: Diagnosis present

## 2015-09-06 LAB — COMPREHENSIVE METABOLIC PANEL
ALK PHOS: 691 U/L — AB (ref 38–126)
ALT: 17 U/L (ref 14–54)
ANION GAP: 8 (ref 5–15)
AST: 21 U/L (ref 15–41)
Albumin: 3.1 g/dL — ABNORMAL LOW (ref 3.5–5.0)
BILIRUBIN TOTAL: 0.5 mg/dL (ref 0.3–1.2)
BUN: 8 mg/dL (ref 6–20)
CALCIUM: 9 mg/dL (ref 8.9–10.3)
CO2: 23 mmol/L (ref 22–32)
CREATININE: 0.57 mg/dL (ref 0.44–1.00)
Chloride: 105 mmol/L (ref 101–111)
Glucose, Bld: 82 mg/dL (ref 65–99)
Potassium: 4.1 mmol/L (ref 3.5–5.1)
Sodium: 136 mmol/L (ref 135–145)
TOTAL PROTEIN: 6.6 g/dL (ref 6.5–8.1)

## 2015-09-06 LAB — POCT URINALYSIS DIP (DEVICE)
Bilirubin Urine: NEGATIVE
Glucose, UA: NEGATIVE mg/dL
KETONES UR: NEGATIVE mg/dL
Leukocytes, UA: NEGATIVE
Nitrite: NEGATIVE
PH: 6 (ref 5.0–8.0)
PROTEIN: NEGATIVE mg/dL
SPECIFIC GRAVITY, URINE: 1.025 (ref 1.005–1.030)
Urobilinogen, UA: 0.2 mg/dL (ref 0.0–1.0)

## 2015-09-06 LAB — URINE MICROSCOPIC-ADD ON: RBC / HPF: NONE SEEN RBC/hpf (ref 0–5)

## 2015-09-06 LAB — URINALYSIS, ROUTINE W REFLEX MICROSCOPIC
BILIRUBIN URINE: NEGATIVE
GLUCOSE, UA: NEGATIVE mg/dL
Ketones, ur: NEGATIVE mg/dL
Nitrite: NEGATIVE
PH: 6 (ref 5.0–8.0)
Protein, ur: NEGATIVE mg/dL

## 2015-09-06 LAB — CBC
HCT: 38.9 % (ref 36.0–46.0)
HEMOGLOBIN: 13.7 g/dL (ref 12.0–15.0)
MCH: 29.3 pg (ref 26.0–34.0)
MCHC: 35.2 g/dL (ref 30.0–36.0)
MCV: 83.1 fL (ref 78.0–100.0)
PLATELETS: 124 10*3/uL — AB (ref 150–400)
RBC: 4.68 MIL/uL (ref 3.87–5.11)
RDW: 14.8 % (ref 11.5–15.5)
WBC: 11.1 10*3/uL — ABNORMAL HIGH (ref 4.0–10.5)

## 2015-09-06 LAB — PROTEIN / CREATININE RATIO, URINE
CREATININE, URINE: 49 mg/dL
Protein Creatinine Ratio: 0.24 mg/mg{Cre} — ABNORMAL HIGH (ref 0.00–0.15)
TOTAL PROTEIN, URINE: 12 mg/dL

## 2015-09-06 LAB — ABO/RH: ABO/RH(D): O POS

## 2015-09-06 LAB — TYPE AND SCREEN
ABO/RH(D): O POS
Antibody Screen: NEGATIVE

## 2015-09-06 MED ORDER — ONDANSETRON HCL 4 MG/2ML IJ SOLN: 4.0000 mg | Freq: Four times a day (QID) | INTRAMUSCULAR | Status: DC | PRN

## 2015-09-06 MED ORDER — SOD CITRATE-CITRIC ACID 500-334 MG/5ML PO SOLN: 30.0000 mL | ORAL | Status: DC | PRN

## 2015-09-06 MED ORDER — HYDRALAZINE HCL 20 MG/ML IJ SOLN: 10.0000 mg | Freq: Once | INTRAMUSCULAR | Status: DC | PRN

## 2015-09-06 MED ORDER — LABETALOL HCL 5 MG/ML IV SOLN
20.0000 mg | INTRAVENOUS | Status: DC | PRN
Start: 2015-09-06 — End: 2015-09-07
  Administered 2015-09-06: 20 mg via INTRAVENOUS
  Administered 2015-09-06: 40 mg via INTRAVENOUS
  Filled 2015-09-06: qty 8
  Filled 2015-09-06: qty 4

## 2015-09-06 MED ORDER — OXYCODONE-ACETAMINOPHEN 5-325 MG PO TABS: 1.0000 | ORAL_TABLET | ORAL | Status: DC | PRN

## 2015-09-06 MED ORDER — LACTATED RINGERS IV SOLN
INTRAVENOUS | Status: DC
  Administered 2015-09-06: 16:00:00 via INTRAVENOUS

## 2015-09-06 MED ORDER — MISOPROSTOL 25 MCG QUARTER TABLET
25.0000 ug | ORAL_TABLET | ORAL | Status: DC | PRN
  Administered 2015-09-06 (×2): 25 ug via VAGINAL
  Filled 2015-09-06 (×3): qty 0.25

## 2015-09-06 MED ORDER — LIDOCAINE HCL (PF) 1 % IJ SOLN
30.0000 mL | INTRAMUSCULAR | Status: AC | PRN
  Administered 2015-09-07: 30 mL via SUBCUTANEOUS
  Filled 2015-09-06: qty 30

## 2015-09-06 MED ORDER — OXYTOCIN 40 UNITS IN LACTATED RINGERS INFUSION - SIMPLE MED: 2.5000 [IU]/h | INTRAVENOUS | Status: DC

## 2015-09-06 MED ORDER — TERBUTALINE SULFATE 1 MG/ML IJ SOLN: 0.2500 mg | Freq: Once | INTRAMUSCULAR | Status: DC | PRN

## 2015-09-06 MED ORDER — LACTATED RINGERS IV SOLN: 500.0000 mL | INTRAVENOUS | Status: DC | PRN

## 2015-09-06 MED ORDER — ACETAMINOPHEN 325 MG PO TABS
650.0000 mg | ORAL_TABLET | ORAL | Status: DC | PRN
  Administered 2015-09-06 (×2): 650 mg via ORAL
  Filled 2015-09-06 (×2): qty 2

## 2015-09-06 MED ORDER — OXYTOCIN BOLUS FROM INFUSION
500.0000 mL | INTRAVENOUS | Status: DC
  Administered 2015-09-07: 500 mL via INTRAVENOUS

## 2015-09-06 MED ORDER — OXYCODONE-ACETAMINOPHEN 5-325 MG PO TABS: 2.0000 | ORAL_TABLET | ORAL | Status: DC | PRN

## 2015-09-06 NOTE — MAU Note (Signed)
Pt went to routine OB appointment today and her blood pressure was increased. Pt has chronic hypertension. Pt c/o headache starting Monday. Pt c/o floaters off and on since Monday. Pt c/o having a nosebleed Monday and today. Pt states baby is moving normally. Pt c/o irregular contractions today. Pt denies bleeding and leaking of water.

## 2015-09-06 NOTE — Progress Notes (Signed)
Pt reports light headache, visual floaters and pinkish spotting today.

## 2015-09-06 NOTE — H&P (Signed)
OBSTETRIC ADMISSION HISTORY AND PHYSICAL  Jasmine Baird is a 30 y.o. female [phone] with IUP at [redacted]w[redacted]d by LMP/13 presenting for elevated BP in clinic with mild HA and floaters this morning. She reports +FMs, No LOF, no VB, no blurry vision, headaches or peripheral edema, and RUQ pain.  She plans on breast feeding. She request possible IUD for birth control.  Pregnancy complicated by 1) chronic hypertension- baseline BP 120-130/70s 2) history of preeclampsia with abruption at 33 weeks   Dating: By L/13 --->  Estimated Date of Delivery: 09/19/15   Clinic  [facility] Prenatal Labs  Dating  LMP, consistent with 12 week ultrasound Blood type: O/Positive/-- (11/21 0000)   Genetic Screen 1 Screen: NT nml   AFP:  Not obtained    Antibody:Negative (11/21 0000)  Anatomic [place] 18 wks, nml, limited views of heart>recheck nml Rubella: Immune (11/21 0000)  GTT Early:               Third trimester: 126 RPR: Nonreactive (11/21 0000)   Flu vaccine  Declines HBsAg: Negative (11/21 0000)   TDaP vaccine  3/30                         Rhogam: HIV: Non-reactive (11/21 0000)   Baby Food  Breast                                             GBS:  negative  Contraception  POP, had larc discussion Pap:  Negative Nov 2016  Circumcision  n/a   Pediatrician  [facility]   Support Person  Vinny ([facility])    Prenatal History/Complications:  Past Medical History: Past Medical History  Diagnosis Date  . Hypertension     Past Surgical History: Past Surgical History  Procedure Laterality Date  . No past surgeries      Obstetrical History: OB History    Gravida Para Term Preterm AB TAB SAB Ectopic Multiple Living   3 1 0 1 1 1 0 0 0 1       Social History: Social History   Social History  . Marital Status: Married    Spouse Name: N/A  . Number of Children: N/A  . Years of Education: N/A   Social History Main Topics  . Smoking status: Never Smoker   . Smokeless tobacco: Never Used  . Alcohol Use: No   . Drug Use: No  . Sexual Activity: Yes   Other Topics Concern  . None   Social History Narrative    Family History: Family History  Problem Relation Age of Onset  . Hypertension Mother   . Hypertension Father   . Hyperlipidemia Father   . Heart disease Maternal Uncle   . Diabetes Maternal Grandmother   . Hypertension Maternal Grandmother   . Cancer Maternal Grandfather 66    PANCREATIC    Allergies: No Known Allergies  Prescriptions prior to admission  Medication Sig Dispense Refill Last Dose  . Prenatal Vit-Fe Fumarate-FA (MULTIVITAMIN-PRENATAL) 27-0.8 MG TABS tablet Take 1 tablet by mouth daily at 12 noon.   09/05/2015 at Unknown time     Review of Systems   All systems reviewed and negative except as stated in HPI  Blood pressure 160/105, pulse 91, temperature 98 F (36.7 C), temperature source Oral, resp. rate 18, height 5\' 1"  (1.549 m), weight  206 lb (93.441 kg), last menstrual period 12/13/2014, SpO2 98 %. General appearance: alert, cooperative and appears stated age Lungs: clear to auscultation bilaterally Heart: regular rate and rhythm Abdomen: soft, non-tender; bowel sounds normal Pelvic: adequate Dilation: Fingertip Effacement (%): Thick Cervical Position: Posterior Station: -3 Presentation: Undeterminable Exam by:: Jasmine Score, Jasmine Baird  Extremities: Homans sign is negative, no sign of DVT DTR's hyperreflexia (3+ on biceps tendon). No clonus Presentation: cephalic Fetal monitoringBaseline: 140 bpm, Variability: Good {> 6 bpm), Accelerations: Reactive and Decelerations: Absent Uterine activityNone     Prenatal labs: ABO, Rh: O/Positive/-- (11/21 0000) Antibody: Negative (11/21 0000) Rubella: Immune (11/21 0000) RPR: NON REAC (03/16 0931)  HBsAg: Negative (11/21 0000)  HIV: NONREACTIVE (03/16 0931)  GBS:   Neg 1 hr Glucola 126 Genetic screening  NT wnl Anatomy US wnl   Prenatal Transfer Tool  Maternal Diabetes: No Genetic Screening:  Normal Maternal Ultrasounds/Referrals: Normal Fetal Ultrasounds or other Referrals:  None Maternal Substance Abuse:  No Significant Maternal Medications:  None Significant Maternal Lab Results: Lab values include: Group B Strep negative  Results for orders placed or performed during the hospital encounter of 09/06/15 (from the past 24 hour(s))  Comprehensive metabolic panel   Collection Time: 09/06/15 10:03 AM  Result Value Ref Range   Sodium 136 135 - 145 mmol/L   Potassium 4.1 3.5 - 5.1 mmol/L   Chloride 105 101 - 111 mmol/L   CO2 23 22 - 32 mmol/L   Glucose, Bld 82 65 - 99 mg/dL   BUN 8 6 - 20 mg/dL   Creatinine, Ser 0.57 0.44 - 1.00 mg/dL   Calcium 9.0 8.9 - 10.3 mg/dL   Total Protein 6.6 6.5 - 8.1 g/dL   Albumin 3.1 (L) 3.5 - 5.0 g/dL   AST 21 15 - 41 U/L   ALT 17 14 - 54 U/L   Alkaline Phosphatase 691 (H) 38 - 126 U/L   Total Bilirubin 0.5 0.3 - 1.2 mg/dL   GFR calc non Af Amer >60 >60 mL/min   GFR calc Af Amer >60 >60 mL/min   Anion gap 8 5 - 15  CBC   Collection Time: 09/06/15 10:03 AM  Result Value Ref Range   WBC 11.1 (H) 4.0 - 10.5 K/uL   RBC 4.68 3.87 - 5.11 MIL/uL   Hemoglobin 13.7 12.0 - 15.0 g/dL   HCT 38.9 36.0 - 46.0 %   MCV 83.1 78.0 - 100.0 fL   MCH 29.3 26.0 - 34.0 pg   MCHC 35.2 30.0 - 36.0 g/dL   RDW 14.8 11.5 - 15.5 %   Platelets 124 (L) 150 - 400 K/uL  Urinalysis, Routine w reflex microscopic (not at [facility])   Collection Time: 09/06/15 10:30 AM  Result Value Ref Range   Color, Urine YELLOW YELLOW   APPearance CLEAR CLEAR   Specific Gravity, Urine <1.005 (L) 1.005 - 1.030   pH 6.0 5.0 - 8.0   Glucose, UA NEGATIVE NEGATIVE mg/dL   Hgb urine dipstick MODERATE (A) NEGATIVE   Bilirubin Urine NEGATIVE NEGATIVE   Ketones, ur NEGATIVE NEGATIVE mg/dL   Protein, ur NEGATIVE NEGATIVE mg/dL   Nitrite NEGATIVE NEGATIVE   Leukocytes, UA SMALL (A) NEGATIVE  Protein / creatinine ratio, urine   Collection Time: 09/06/15 10:30 AM  Result Value Ref Range    Creatinine, Urine 49.00 mg/dL   Total Protein, Urine 12 mg/dL   Protein Creatinine Ratio 0.24 (H) 0.00 - 0.15 mg/mg[Cre]  Urine microscopic-add on   Collection Time:  09/06/15 10:30 AM  Result Value Ref Range   Squamous Epithelial / LPF 0-5 (A) NONE SEEN   WBC, UA 6-30 0 - 5 WBC/hpf   RBC / HPF NONE SEEN 0 - 5 RBC/hpf   Bacteria, UA RARE (A) NONE SEEN  Results for orders placed or performed in visit on 09/06/15 (from the past 24 hour(s))  POCT urinalysis dip (device)   Collection Time: 09/06/15  8:52 AM  Result Value Ref Range   Glucose, UA NEGATIVE NEGATIVE mg/dL   Bilirubin Urine NEGATIVE NEGATIVE   Ketones, ur NEGATIVE NEGATIVE mg/dL   Specific Gravity, Urine 1.025 1.005 - 1.030   Hgb urine dipstick TRACE (A) NEGATIVE   pH 6.0 5.0 - 8.0   Protein, ur NEGATIVE NEGATIVE mg/dL   Urobilinogen, UA 0.2 0.0 - 1.0 mg/dL   Nitrite NEGATIVE NEGATIVE   Leukocytes, UA NEGATIVE NEGATIVE    Patient Active Problem List   Diagnosis Date Noted  . Previous preterm delivery in third trimester, antepartum 04/13/2015  . Supervision of high risk pregnancy, antepartum 03/16/2015  . Hx of preeclampsia, prior pregnancy, currently pregnant 03/16/2015  . Chronic hypertension in pregnancy 03/16/2015    Assessment/Plan:  CECILI AURIEMMA is a 30 y.o. [phone] at [redacted]w[redacted]d here for elevated blood pressure, severe range in MAU that required labetalol treatment.  cHTN with superimposed preeclampsia  #Labor:  IOL for superimposed preeclampsia without severe features. Plan to start with cytotec and when appropriate FB.  #Pain: Prn meds and epidural > 5cm #FWB:  cat I #ID:  GBS neg #MOF:  breast #MOC: IUD #Circ:  N/a  #Preeclampsia without severe features: BP in MAU (170/110 and 184/124) and treated with IV labetalol x2 with greatly improved BP. No s/sx of HELLP and labs are wnl. Will monitor with low treshold to start magnesium.   Caren Macadam, MD  09/06/2015, 11:47 AM

## 2015-09-06 NOTE — Progress Notes (Signed)
Subjective:  Jasmine Baird is a 30 y.o. [phone] at [redacted]w[redacted]d being seen today for ongoing prenatal care.  She is currently monitored for the following issues for this high-risk pregnancy and has Supervision of high risk pregnancy, antepartum; Hx of preeclampsia, prior pregnancy, currently pregnant; Chronic hypertension in pregnancy; Previous preterm delivery in third trimester, antepartum; and Chronic hypertension with superimposed preeclampsia on her problem list.  Patient reports headache and vision changes.  Contractions: Irregular. Vag. Bleeding: Scant.  Movement: Present. Denies leaking of fluid.   The following portions of the patient's history were reviewed and updated as appropriate: allergies, current medications, past family history, past medical history, past social history, past surgical history and problem list. Problem list updated.  Objective:   Filed Vitals:   09/06/15 0841  BP: 147/92  Pulse: 90  Weight: 206 lb 9.6 oz (93.713 kg)    Fetal Status: Fetal Heart Rate (bpm): NST-R   Movement: Present  Presentation: Vertex  General:  Alert, oriented and cooperative. Patient is in no acute distress.  Skin: Skin is warm and dry. No rash noted.   Cardiovascular: Normal heart rate noted  Respiratory: Normal respiratory effort, no problems with respiration noted  Abdomen: Soft, gravid, appropriate for gestational age. Pain/Pressure: Present     Pelvic: Vag. Bleeding: Scant Vag D/C Character: Mucous   Cervical exam deferred        Extremities: Normal range of motion.  Edema: Mild pitting, slight indentation  Mental Status: Normal mood and affect. Normal behavior. Normal judgment and thought content.   Urinalysis: Urine Protein: Negative Urine Glucose: Negative  Assessment and Plan:  Pregnancy: [id number] at [redacted]w[redacted]d  1. Chronic hypertension in pregnancy, third trimester - MAU for PRex labs - Amniotic fluid index with NST  2. Supervision of high risk pregnancy, antepartum, third  trimester - Close monitoring  3. Previous preterm delivery in third trimester, antepartum - Received 17-p during pregnancy  Term labor symptoms and general obstetric precautions including but not limited to vaginal bleeding, contractions, leaking of fluid and fetal movement were reviewed in detail with the patient. Please refer to After Visit Summary for other counseling recommendations.    Venia Carbon Michiel Cowboy, CNM

## 2015-09-06 NOTE — Progress Notes (Signed)
BP retaken with larger cuff

## 2015-09-06 NOTE — Progress Notes (Signed)
Attempt to start IV. Will retake

## 2015-09-07 ENCOUNTER — Inpatient Hospital Stay (HOSPITAL_COMMUNITY): Payer: Medicaid Other | Admitting: Anesthesiology

## 2015-09-07 ENCOUNTER — Encounter (HOSPITAL_COMMUNITY): Payer: Self-pay | Admitting: Anesthesiology

## 2015-09-07 DIAGNOSIS — Z3A38 38 weeks gestation of pregnancy: Secondary | ICD-10-CM

## 2015-09-07 DIAGNOSIS — O1092 Unspecified pre-existing hypertension complicating childbirth: Secondary | ICD-10-CM

## 2015-09-07 DIAGNOSIS — O114 Pre-existing hypertension with pre-eclampsia, complicating childbirth: Secondary | ICD-10-CM

## 2015-09-07 LAB — COMPREHENSIVE METABOLIC PANEL
ALBUMIN: 2.5 g/dL — AB (ref 3.5–5.0)
ALK PHOS: 599 U/L — AB (ref 38–126)
ALT: 15 U/L (ref 14–54)
AST: 23 U/L (ref 15–41)
Anion gap: 10 (ref 5–15)
BILIRUBIN TOTAL: 0.2 mg/dL — AB (ref 0.3–1.2)
BUN: 7 mg/dL (ref 6–20)
CALCIUM: 8.6 mg/dL — AB (ref 8.9–10.3)
CO2: 21 mmol/L — ABNORMAL LOW (ref 22–32)
Chloride: 104 mmol/L (ref 101–111)
Creatinine, Ser: 0.64 mg/dL (ref 0.44–1.00)
GFR calc Af Amer: >60 mL/min (ref >60)
GFR calc non Af Amer: >60 mL/min (ref >60)
GLUCOSE: 193 mg/dL — AB (ref 65–99)
Potassium: 4 mmol/L (ref 3.5–5.1)
Sodium: 135 mmol/L (ref 135–145)
TOTAL PROTEIN: 5.1 g/dL — AB (ref 6.5–8.1)

## 2015-09-07 LAB — CBC
HEMATOCRIT: 36.9 % (ref 36.0–46.0)
HEMATOCRIT: 37 % (ref 36.0–46.0)
HEMOGLOBIN: 12.6 g/dL (ref 12.0–15.0)
Hemoglobin: 12.6 g/dL (ref 12.0–15.0)
MCH: 28.3 pg (ref 26.0–34.0)
MCH: 28.6 pg (ref 26.0–34.0)
MCHC: 34.1 g/dL (ref 30.0–36.0)
MCHC: 34.1 g/dL (ref 30.0–36.0)
MCV: 82.7 fL (ref 78.0–100.0)
MCV: 83.9 fL (ref 78.0–100.0)
PLATELETS: 124 10*3/uL — AB (ref 150–400)
Platelets: 125 10*3/uL — ABNORMAL LOW (ref 150–400)
RBC: 4.46 MIL/uL (ref 3.87–5.11)
RBC: 4.41 MIL/uL (ref 3.87–5.11)
RDW: 15.1 % (ref 11.5–15.5)
RDW: 14.7 % (ref 11.5–15.5)
WBC: 10.9 10*3/uL — ABNORMAL HIGH (ref 4.0–10.5)
WBC: 14.9 10*3/uL — AB (ref 4.0–10.5)

## 2015-09-07 LAB — RPR: RPR: NONREACTIVE

## 2015-09-07 MED ORDER — ONDANSETRON HCL 4 MG PO TABS: 4.0000 mg | ORAL_TABLET | ORAL | Status: DC | PRN

## 2015-09-07 MED ORDER — EPHEDRINE 5 MG/ML INJ: 10.0000 mg | INTRAVENOUS | Status: DC | PRN

## 2015-09-07 MED ORDER — ZOLPIDEM TARTRATE 5 MG PO TABS: 5.0000 mg | ORAL_TABLET | Freq: Every evening | ORAL | Status: DC | PRN

## 2015-09-07 MED ORDER — DIPHENHYDRAMINE HCL 25 MG PO CAPS: 25.0000 mg | ORAL_CAPSULE | Freq: Four times a day (QID) | ORAL | Status: DC | PRN

## 2015-09-07 MED ORDER — SIMETHICONE 80 MG PO CHEW: 80.0000 mg | CHEWABLE_TABLET | ORAL | Status: DC | PRN

## 2015-09-07 MED ORDER — PHENYLEPHRINE 40 MCG/ML (10ML) SYRINGE FOR IV PUSH (FOR BLOOD PRESSURE SUPPORT): 80.0000 ug | PREFILLED_SYRINGE | INTRAVENOUS | Status: DC | PRN

## 2015-09-07 MED ORDER — PRENATAL MULTIVITAMIN CH
1.0000 | ORAL_TABLET | Freq: Every day | ORAL | Status: DC
  Administered 2015-09-07 – 2015-09-09 (×3): 1 via ORAL
  Filled 2015-09-07 (×2): qty 1

## 2015-09-07 MED ORDER — ACETAMINOPHEN 325 MG PO TABS
650.0000 mg | ORAL_TABLET | ORAL | Status: DC | PRN
  Administered 2015-09-07: 650 mg via ORAL
  Filled 2015-09-07: qty 2

## 2015-09-07 MED ORDER — MISOPROSTOL 200 MCG PO TABS
ORAL_TABLET | ORAL | Status: AC
  Administered 2015-09-07: 800 ug
  Filled 2015-09-07: qty 4

## 2015-09-07 MED ORDER — HYDROCHLOROTHIAZIDE 12.5 MG PO CAPS
12.5000 mg | ORAL_CAPSULE | Freq: Every day | ORAL | Status: DC
  Administered 2015-09-07 – 2015-09-09 (×3): 12.5 mg via ORAL
  Filled 2015-09-07 (×4): qty 1

## 2015-09-07 MED ORDER — DIBUCAINE 1 % RE OINT
1.0000 "application " | TOPICAL_OINTMENT | RECTAL | Status: DC | PRN
  Filled 2015-09-07: qty 28.4

## 2015-09-07 MED ORDER — COCONUT OIL OIL
1.0000 "application " | TOPICAL_OIL | Status: DC | PRN
  Administered 2015-09-09: 1 via TOPICAL
  Filled 2015-09-07 (×2): qty 120

## 2015-09-07 MED ORDER — WITCH HAZEL-GLYCERIN EX PADS: 1.0000 "application " | MEDICATED_PAD | CUTANEOUS | Status: DC | PRN

## 2015-09-07 MED ORDER — MISOPROSTOL 200 MCG PO TABS: 800.0000 ug | ORAL_TABLET | Freq: Once | ORAL | Status: DC

## 2015-09-07 MED ORDER — LACTATED RINGERS IV SOLN
INTRAVENOUS | Status: DC
  Administered 2015-09-07 (×2): via INTRAVENOUS

## 2015-09-07 MED ORDER — IBUPROFEN 600 MG PO TABS
600.0000 mg | ORAL_TABLET | Freq: Four times a day (QID) | ORAL | Status: DC
  Administered 2015-09-07 – 2015-09-09 (×9): 600 mg via ORAL
  Filled 2015-09-07 (×8): qty 1

## 2015-09-07 MED ORDER — OXYTOCIN 40 UNITS IN LACTATED RINGERS INFUSION - SIMPLE MED
1.0000 m[IU]/min | INTRAVENOUS | Status: DC
  Administered 2015-09-07: 2 m[IU]/min via INTRAVENOUS
  Filled 2015-09-07: qty 1000

## 2015-09-07 MED ORDER — MAGNESIUM SULFATE 50 % IJ SOLN
2.0000 g/h | INTRAVENOUS | Status: AC
  Administered 2015-09-08: 2 g/h via INTRAVENOUS
  Filled 2015-09-07 (×2): qty 80

## 2015-09-07 MED ORDER — LACTATED RINGERS IV SOLN: 500.0000 mL | Freq: Once | INTRAVENOUS | Status: DC

## 2015-09-07 MED ORDER — FENTANYL CITRATE (PF) 100 MCG/2ML IJ SOLN: 100.0000 ug | INTRAMUSCULAR | Status: DC | PRN

## 2015-09-07 MED ORDER — LIDOCAINE HCL (PF) 1 % IJ SOLN
INTRAMUSCULAR | Status: DC | PRN
  Administered 2015-09-07 (×2): 6 mL

## 2015-09-07 MED ORDER — DIPHENHYDRAMINE HCL 50 MG/ML IJ SOLN: 12.5000 mg | INTRAMUSCULAR | Status: DC | PRN

## 2015-09-07 MED ORDER — MAGNESIUM SULFATE BOLUS VIA INFUSION
4.0000 g | Freq: Once | INTRAVENOUS | Status: AC
  Administered 2015-09-07: 4 g via INTRAVENOUS
  Filled 2015-09-07: qty 500

## 2015-09-07 MED ORDER — ONDANSETRON HCL 4 MG/2ML IJ SOLN: 4.0000 mg | INTRAMUSCULAR | Status: DC | PRN

## 2015-09-07 MED ORDER — BENZOCAINE-MENTHOL 20-0.5 % EX AERO
1.0000 "application " | INHALATION_SPRAY | CUTANEOUS | Status: DC | PRN
  Administered 2015-09-07: 1 via TOPICAL
  Filled 2015-09-07 (×2): qty 56

## 2015-09-07 MED ORDER — TETANUS-DIPHTH-ACELL PERTUSSIS 5-2.5-18.5 LF-MCG/0.5 IM SUSP: 0.5000 mL | Freq: Once | INTRAMUSCULAR | Status: DC

## 2015-09-07 MED ORDER — LISINOPRIL 10 MG PO TABS
10.0000 mg | ORAL_TABLET | Freq: Every day | ORAL | Status: DC
  Administered 2015-09-07 – 2015-09-09 (×3): 10 mg via ORAL
  Filled 2015-09-07 (×4): qty 1

## 2015-09-07 MED ORDER — FENTANYL 2.5 MCG/ML BUPIVACAINE 1/10 % EPIDURAL INFUSION (WH - ANES)
14.0000 mL/h | INTRAMUSCULAR | Status: DC | PRN
  Administered 2015-09-07: 14 mL/h via EPIDURAL
  Filled 2015-09-07: qty 125

## 2015-09-07 MED ORDER — SENNOSIDES-DOCUSATE SODIUM 8.6-50 MG PO TABS
2.0000 | ORAL_TABLET | ORAL | Status: DC
  Administered 2015-09-07 – 2015-09-08 (×2): 2 via ORAL
  Filled 2015-09-07 (×3): qty 2

## 2015-09-07 NOTE — Anesthesia Postprocedure Evaluation (Signed)
Anesthesia Post Note  Patient: Jasmine Baird  Procedure(s) Performed: * No procedures listed *  Patient location during evaluation: [facility] Anesthesia Type: Epidural Level of consciousness: awake, awake and alert and oriented Pain management: pain level controlled Vital Signs Assessment: post-procedure vital signs reviewed and stable Respiratory status: spontaneous breathing, nonlabored ventilation and respiratory function stable Cardiovascular status: stable Postop Assessment: no headache, no backache, patient able to bend at knees, no signs of nausea or vomiting and adequate PO intake Anesthetic complications: no     Last Vitals:  Filed Vitals:   09/07/15 1231 09/07/15 1344  BP: 127/83 123/75  Pulse: 83 96  Temp: 37.2 C 37.1 C  Resp: 18 18    Last Pain:  Filed Vitals:   09/07/15 1348  PainSc: 1    Pain Goal: Patients Stated Pain Goal: 3 (09/07/15 1036)               [name]

## 2015-09-07 NOTE — Anesthesia Procedure Notes (Signed)
Epidural Patient location during procedure: OB  Staffing Anesthesiologist: Franne Grip  Preanesthetic Checklist Completed: patient identified, site marked, surgical consent, pre-op evaluation, timeout performed, IV checked, risks and benefits discussed and monitors and equipment checked  Epidural Patient position: sitting Prep: DuraPrep Patient monitoring: heart rate and blood pressure Approach: midline Location: L4-L5 Injection technique: LOR saline  Needle:  Needle type: Tuohy  Needle gauge: 17 G Needle length: 9 cm Needle insertion depth: 6 cm Catheter type: closed end flexible Catheter size: 19 Gauge Catheter at skin depth: 12 cm Test dose: negative and Other  Assessment Events: blood not aspirated, injection not painful, no injection resistance, negative IV test and no paresthesia  Additional Notes Reason for block:procedure for pain

## 2015-09-07 NOTE — Progress Notes (Signed)
Labor Progress Note Jasmine Baird is a 30 y.o. [phone] at [redacted]w[redacted]d presented for IOL for cHTN with superimposed preeclampsia.  S: Uncomfortable with contractions.   O:  BP 127/90 mmHg  Pulse 94  Temp(Src) 98.3 F (36.8 C) (Oral)  Resp 14  Ht 5\' 1"  (1.549 m)  Wt 93.441 kg (206 lb)  BMI 38.94 kg/m2  SpO2 98%  LMP 12/13/2014 (Exact Date) EFM: 150/moderate variability/15x15 accelerations/no decelerations  CVE: Dilation: 2 Effacement (%): Thick Cervical Position: Posterior Station: -3 Presentation: Vertex Exam by:: Marlou Porch, CNM   A&P: 30 y.o. [id number] [redacted]w[redacted]d presented for IOL for cHTN with superimposed preeclampsia. Recent systolic BP [id number] and diastolic BP [id number]. She required labetalol in the MAU but has not needed any since.  #Labor: Progress. Given Cytotec at 1711. #Pain: PRN pain meds. Will start epidural >5cm. #FWB: Cat I.  #GBS negative  Milon Score, medical student

## 2015-09-07 NOTE — Lactation Note (Signed)
This note was copied from a baby's chart. Lactation Consultation Note  Initial visit made.  Baby is now 57 hours old.  Mom states baby has been to breast without difficulty latching or feeding.  She denies discomfort.  Baby is currently swaddled and sleeping.  Reviewed feeding cues and instructed to feed with any cue.  Encouraged to call for concerns/assist prn.  Patient Name: Jasmine Baird [id number] Date: 09/07/2015 Reason for consult: Initial assessment   Maternal Data    Feeding Feeding Type: Breast Fed  LATCH Score/Interventions Latch: Grasps breast easily, tongue down, lips flanged, rhythmical sucking.  Audible Swallowing: Spontaneous and intermittent Intervention(s): Skin to skin;Hand expression  Type of Nipple: Everted at rest and after stimulation  Comfort (Breast/Nipple): Soft / non-tender     Hold (Positioning): No assistance needed to correctly position infant at breast. Intervention(s): Breastfeeding basics reviewed;Support Pillows;Position options;Skin to skin  LATCH Score: 10  Lactation Tools Discussed/Used     Consult Status Consult Status: Follow-up Date: 09/08/15 Follow-up type: In-patient    Ave Filter 09/07/2015, 11:36 AM

## 2015-09-07 NOTE — Anesthesia Preprocedure Evaluation (Addendum)
Anesthesia Evaluation  Patient identified by MRN, date of birth, ID band Patient awake  General Assessment Comment:preeclampsia  Reviewed: Allergy & Precautions, NPO status , Patient's Chart, lab work & pertinent test results  Airway Mallampati: II  TM Distance: >3 FB Neck ROM: Full    Dental no notable dental hx.    Pulmonary neg pulmonary ROS,    Pulmonary exam normal breath sounds clear to auscultation       Cardiovascular hypertension, Normal cardiovascular exam Rhythm:Regular Rate:Normal     Neuro/Psych negative neurological ROS  negative psych ROS   GI/Hepatic negative GI ROS, Neg liver ROS,   Endo/Other  negative endocrine ROS  Renal/GU negative Renal ROS  negative genitourinary   Musculoskeletal negative musculoskeletal ROS (+)   Abdominal (+) + obese,   Peds negative pediatric ROS (+)  Hematology negative hematology ROS (+)   Anesthesia Other Findings   Reproductive/Obstetrics negative OB ROS                            Anesthesia Physical Anesthesia Plan  ASA: III  Anesthesia Plan: Epidural   Post-op Pain Management:    Induction: Intravenous  Airway Management Planned: Natural Airway  Additional Equipment:   Intra-op Plan:   Post-operative Plan:   Informed Consent: I have reviewed the patients History and Physical, chart, labs and discussed the procedure including the risks, benefits and alternatives for the proposed anesthesia with the patient or authorized representative who has indicated his/her understanding and acceptance.   Dental advisory given  Plan Discussed with: CRNA  Anesthesia Plan Comments: (Informed consent obtained prior to proceeding including risk of failure, 1% risk of PDPH, risk of minor discomfort and bruising.  Discussed rare but serious complications including epidural abscess, permanent nerve injury, epidural hematoma.  Discussed  alternatives to epidural analgesia and patient desires to proceed.  Timeout performed pre-procedure verifying patient name, procedure, and platelet count.  Patient tolerated procedure well. )       Anesthesia Quick Evaluation

## 2015-09-08 ENCOUNTER — Other Ambulatory Visit: Payer: Medicaid Other

## 2015-09-08 NOTE — Progress Notes (Addendum)
Daily Post Partum Note  Jasmine Baird is a 30 y.o. [id number] PPD#1 s/p  TSVD/2nd degree  @ [redacted]w[redacted]d.  Pregnancy c/b cHTN (pt dx with pre-x with severe features based on BP, neuro on admission), elevated BMI. Gestational thrombocytopenia 24hr/overnight events:  None. She is s/p 24hrs of PP Mg (6/1 @ 0340 delivery)  Subjective:  No s/s of pre-x and meeting all PP goals  Objective:   Filed Vitals:   09/08/15 0234 09/08/15 0300 09/08/15 0400 09/08/15 0543  BP: 135/94   124/90  Pulse: 82   82  Temp: 97.8 F (36.6 C)   98.9 F (37.2 C)  TempSrc: Oral   Oral  Resp: 18 18 18 18   Height:      Weight:    202 lb 0.1 oz (91.629 kg)  SpO2: 97% 98% 98% 96%     Current Vital Signs 24h Vital Sign Ranges  T 98.9 F (37.2 C) Temp  Avg: 98.5 F (36.9 C)  Min: 97.8 F (36.6 C)  Max: 99 F (37.2 C)  BP 124/90 mmHg BP  Min: 116/84  Max: 151/90  HR 82 Pulse  Avg: 93.7  Min: 82  Max: 105  RR 18 Resp  Avg: 18.2  Min: 16  Max: 20  SaO2 96 % Not Delivered SpO2  Avg: 98 %  Min: 96 %  Max: 100 %       24 Hour I/O Current Shift I/O  Time Ins Outs 06/01 0701 - 06/02 0700 In: [id number] [P.O.:3260; I.V.:2414] Out: [id number] [Urine:6300]      General: NAD Abdomen: obese, FF below the umbilicus Perineum: deferred Skin:  Warm and dry.  Cardiovascular: S1, S2 normal, no murmur, rub or gallop, regular rate and rhythm Respiratory:  Clear to auscultation bilateral. Normal respiratory effort Extremities: 1+ b/l LE edema. nttp   Medications Current Facility-Administered Medications  Medication Dose Route Frequency Provider Last Rate Last Dose  . acetaminophen (TYLENOL) tablet 650 mg  650 mg Oral Q4H PRN Mercy Riding, MD   650 mg at 09/07/15 0928  . benzocaine-Menthol (DERMOPLAST) 20-0.5 % topical spray 1 application  1 application Topical PRN Mercy Riding, MD   1 application at [id number]  . coconut oil  1 application Topical PRN Mercy Riding, MD      . witch hazel-glycerin (TUCKS) pad 1 application  1  application Topical PRN Mercy Riding, MD       And  . dibucaine (NUPERCAINAL) 1 % rectal ointment 1 application  1 application Rectal PRN Mercy Riding, MD      . diphenhydrAMINE (BENADRYL) capsule 25 mg  25 mg Oral Q6H PRN Mercy Riding, MD      . hydrochlorothiazide (MICROZIDE) capsule 12.5 mg  12.5 mg Oral Daily Manya Silvas, CNM   12.5 mg at 09/07/15 1037  . ibuprofen (ADVIL,MOTRIN) tablet 600 mg  600 mg Oral Q6H Mercy Riding, MD   600 mg at 09/08/15 0552  . lactated ringers infusion   Intravenous Continuous Manya Silvas, CNM   Stopped at 09/08/15 0559  . lidocaine (PF) (XYLOCAINE) 1 % injection 30 mL  30 mL Subcutaneous PRN Caren Macadam, MD   30 mL at 09/07/15 0349  . lisinopril (PRINIVIL,ZESTRIL) tablet 10 mg  10 mg Oral Daily Manya Silvas, CNM   10 mg at 09/07/15 1037  . ondansetron (ZOFRAN) tablet 4 mg  4 mg Oral Q4H PRN Mercy Riding, MD       Or  .  ondansetron (ZOFRAN) injection 4 mg  4 mg Intravenous Q4H PRN Mercy Riding, MD      . prenatal multivitamin tablet 1 tablet  1 tablet Oral Q1200 Mercy Riding, MD   1 tablet at 09/07/15 1125  . senna-docusate (Senokot-S) tablet 2 tablet  2 tablet Oral Q24H Mercy Riding, MD   2 tablet at 09/07/15 2304  . simethicone (MYLICON) chewable tablet 80 mg  80 mg Oral PRN Mercy Riding, MD      . Tdap (BOOSTRIX) injection 0.5 mL  0.5 mL Intramuscular Once Mercy Riding, MD      . zolpidem (AMBIEN) tablet 5 mg  5 mg Oral QHS PRN Mercy Riding, MD        Labs:   Recent Labs Lab 09/06/15 1003 09/07/15 0147 09/07/15 0644  WBC 11.1* 10.9* 14.9*  HGB 13.7 12.6 12.6  HCT 38.9 36.9 37.0  PLT 124* 124* 125*    Recent Labs Lab 09/06/15 1003 09/07/15 0644  NA 136 135  K 4.1 4.0  CL 105 104  CO2 23 21*  BUN 8 7  CREATININE 0.57 0.64  GLUCOSE 82 193*  CALCIUM 9.0 8.6*    Assessment & Plan:  Pt doing well *Postpartum/postop: routine care *cHTN with superimposed pre-x with severe features: good BP control on lisinopril and HCTZ  and no s/s. Continue current plan of care *Dispo: likely tomorrow  O POS / Rubella Immune /  RPR negative / HIV negative / HepBsAg negative / Tdap UTD: yes/Flu shot: declined/ pap neg 2016 / Breast  / Contraception: IUD with possible pill bridge / circ: not applicable/ Follow up: Other [facility] clinic  Durene Romans MD Attending Center for [facility])

## 2015-09-08 NOTE — Lactation Note (Signed)
This note was copied from a baby's chart. Lactation Consultation Note  Patient Name: Jasmine Baird [id number] Date: 09/08/2015 Reason for consult: Follow-up assessment   With this mom of a term baby, now 58 hours old, and was at 5% weight loss at around 19 hours of life. The baby has been cluster feeding, and has more than adequate amount of wet and dirty diapers.. On exam, mom has wide spaced breasts, cone shapes and sloppy. Small drops of colostrum with hand expression. The baby is jaundiced, and a 6 am bili is pending. I mentioned to mom that there is a possibility, if bili is high, the baby may need to be supplemented with formula. Mom has a 22 year old son, who was a [redacted] week gestation NICU baby. Mom pumped for this baby, but did not pump 8 times a day, had 2-3 ouoces of milk at one point, with pumping , but lost her milk supply at 2 months. Due to this exam and history, close attention should be paid to baby's output and weight, and the fullness of mom's breasts, and audible swallows  with breastfeeding.I assisted mom with latching the baby with pillow support for cross cradle, and mom reports this latch feeling much better. I advised mom to call for help with latching, as needed.  Mom knows to call for questions/concerns. .    Maternal Data    Feeding Feeding Type: Breast Fed Length of feed:  (breast fed 5 minutes on left breast, and was on right breast when I left the room. )  LATCH Score/Interventions Latch: Grasps breast easily, tongue down, lips flanged, rhythmical sucking.  Audible Swallowing: None Intervention(s): Skin to skin;Hand expression  Type of Nipple: Everted at rest and after stimulation  Comfort (Breast/Nipple): Soft / non-tender (wide spaced , cone shaped breasts, with drops fo colostrum with hand expression. baby cluter feeding, crying angrily  afater 5 minutes on left breast, lcuster feeding - 5 % weight loss at 19 hours of life )     Hold (Positioning): Assistance  needed to correctly position infant at breast and maintain latch. Intervention(s): Breastfeeding basics reviewed;Support Pillows;Position options;Skin to skin  LATCH Score: 7  Lactation Tools Discussed/Used     Consult Status Consult Status: Follow-up Date: 09/09/15 Follow-up type: In-patient    Tonna Corner 09/08/2015, 10:09 AM

## 2015-09-09 MED ORDER — HYDROCHLOROTHIAZIDE 12.5 MG PO CAPS: 12.5000 mg | ORAL_CAPSULE | Freq: Every day | ORAL | Status: DC

## 2015-09-09 MED ORDER — LISINOPRIL 10 MG PO TABS: 20.0000 mg | ORAL_TABLET | Freq: Every day | ORAL | Status: DC

## 2015-09-09 NOTE — Lactation Note (Signed)
This note was copied from a baby's chart. Lactation Consultation Note  Patient Name: Jasmine Baird [id number] Date: 09/09/2015 Reason for consult: Follow-up assessment Infant is 23 hours old & seen by [facility] for follow-up assessment. Mom had been mostly BF but then gave formula this morning (baby has received 4 bottles of 36mL of Alimentum). Baby has had 7.9% wt loss at 44 hours. Mom stated she was getting sore and thinks it is due to baby cluster feeding throughout the night & stated she gave formula because she still only has colostrum. LC explained that it can take 3-5 days for her full milk volume to come in & that stimulation is very important for milk supply, especially in the first few weeks. Baby started showing feeding cues so asked if mom wanted to BF while LC there, mom agreed. Encouraged mom to do some hand expression first - mom had difficulty at first but after LC demonstrated, mom was able to & drops were seen; mom will need to continue working on technique. Mom latched baby on right breast in cross-cradle hold. Baby latched right away & swallows were noted. Mom reported some discomfort; showed mom how to check to make sure lips were flanged out & how to help keep her mouth wide by slightly applying pressure on baby's chin. Also encouraged mom to continue to keep baby close into her breast so baby's nose touches her breast with pillow/ blanket support. Baby had recently BF & received formula so baby fell asleep after ~10 mins and would not wake back up to continue BF. Encouraged mom to continue BF on cue at least 8-12x in 24hours and to avoid formula to encourage her milk supply to increase. Mom asked whether they should wait to give formula only at night after BF; LC explained that it would be best to keep her at the breast and that if they do supplement after BF that it would be good for her to pump so her body knows how much to make & then they can use her breastmilk after the next BF if they  supplement. Mom stated she already has a Medela pump at home. FOB asked about milk storage - reviewed guidelines. Also reminded mom of [place] number, BF pages in [place] booklet, engorgement prevention/ treatment. Provided mom with hand pump for occasional use - showed her how to use & clean it. Mom reports no questions at this time.  Maternal Data    Feeding Feeding Type: Breast Fed Nipple Type: Slow - flow Length of feed: 10 min  LATCH Score/Interventions Latch: Grasps breast easily, tongue down, lips flanged, rhythmical sucking.  Audible Swallowing: Spontaneous and intermittent  Type of Nipple: Everted at rest and after stimulation  Comfort (Breast/Nipple): Soft / non-tender     Hold (Positioning): Assistance needed to correctly position infant at breast and maintain latch. Intervention(s): Support Pillows  LATCH Score: 9  Lactation Tools Discussed/Used Pump Review: Setup, frequency, and cleaning;Milk Storage   Consult Status Consult Status: Complete    Yvonna Alanis 09/09/2015, 11:17 AM

## 2015-09-09 NOTE — Discharge Instructions (Signed)

## 2015-09-09 NOTE — Progress Notes (Signed)
Discharge teaching complete. Pt understood all information and did not have any questions. Pt ambulated out of the hospital and discharged home to family.  

## 2015-09-09 NOTE — Discharge Summary (Signed)
OB Discharge Summary     Patient Name: Jasmine Baird DOB: February 27, 1986 MRN: [id number]  Date of admission: 09/06/2015 Delivering MD: Manya Silvas   Date of discharge: 09/09/2015  Admitting diagnosis: 42WKS, CHRONIC HYPERTENSION Intrauterine pregnancy: [redacted]w[redacted]d     Secondary diagnosis:  Active Problems:   Supervision of high risk pregnancy, antepartum   Hx of preeclampsia, prior pregnancy, currently pregnant   Chronic hypertension in pregnancy   Previous preterm delivery in third trimester, antepartum   Chronic hypertension with superimposed preeclampsia   NSVD (normal spontaneous vaginal delivery)  Additional problems: none     Discharge diagnosis: Term Pregnancy Delivered and CHTN with superimposed preeclampsia                                                                                                Post partum procedures:none  Augmentation: AROM, Pitocin, Cytotec and Foley Balloon  Complications: None  Hospital course:  Induction of Labor With Vaginal Delivery   30 y.o. yo [id number] at [redacted]w[redacted]d was admitted to the hospital 09/06/2015 for induction of labor.  Indication for induction: superimposed preeclampsia.  Patient had an uncomplicated labor course as follows: Membrane Rupture Time/Date: 2:58 AM ,09/07/2015   Intrapartum Procedures: Episiotomy: None [1]                                         Lacerations:  2nd degree [3]  Patient had delivery of a Viable infant.  Information for the patient's newborn:  Alvira Monday Girl Jayleen [id number]  Delivery Method: Vaginal, Spontaneous Delivery (Filed from Delivery Summary)   09/07/2015  Details of delivery can be found in separate delivery note.  Patient had a routine postpartum course. Patient is discharged home 09/09/2015.   Physical exam  Filed Vitals:   09/09/15 0147 09/09/15 0535 09/09/15 0745 09/09/15 0929  BP: 125/87 152/95 130/94 136/100  Pulse: 64 71 70 82  Temp: 97.8 F (36.6 C) 98.7 F (37.1 C)  97.8 F (36.6 C)   TempSrc: Oral Oral  Oral  Resp: 18 18    Height:      Weight:  204 lb 8 oz (92.761 kg)    SpO2: 100% 100%  99%   General: alert, cooperative and no distress Lochia: appropriate Uterine Fundus: firm  DVT Evaluation: No evidence of DVT seen on physical exam. Negative Homan's sign. Labs: Lab Results  Component Value Date   WBC 14.9* 09/07/2015   HGB 12.6 09/07/2015   HCT 37.0 09/07/2015   MCV 83.9 09/07/2015   PLT 125* 09/07/2015   CMP Latest Ref Rng 09/07/2015  Glucose 65 - 99 mg/dL 193(H)  BUN 6 - 20 mg/dL 7  Creatinine 0.44 - 1.00 mg/dL 0.64  Sodium 135 - 145 mmol/L 135  Potassium 3.5 - 5.1 mmol/L 4.0  Chloride 101 - 111 mmol/L 104  CO2 22 - 32 mmol/L 21(L)  Calcium 8.9 - 10.3 mg/dL 8.6(L)  Total Protein 6.5 - 8.1 g/dL 5.1(L)  Total Bilirubin 0.3 - 1.2 mg/dL 0.2(L)  Alkaline  Phos 38 - 126 U/L 599(H)  AST 15 - 41 U/L 23  ALT 14 - 54 U/L 15    Discharge instruction: per After Visit Summary and "Baby and Me Booklet".  After visit meds:    Medication List    TAKE these medications        hydrochlorothiazide 12.5 MG capsule  Commonly known as:  MICROZIDE  Take 1 capsule (12.5 mg total) by mouth daily.     lisinopril 10 MG tablet  Commonly known as:  PRINIVIL,ZESTRIL  Take 2 tablets (20 mg total) by mouth daily.     multivitamin-prenatal 27-0.8 MG Tabs tablet  Take 1 tablet by mouth daily at 12 noon.        Diet: routine diet  Activity: Advance as tolerated. Pelvic rest for 6 weeks.   Outpatient follow up:6 weeks Follow up Appt:No future appointments. Follow up Visit:No Follow-up on file.  Postpartum contraception: IUD Mirena  Newborn Data: Live born female  Birth Weight: 7 lb 2.5 oz (3246 g) APGAR: 9, 9  Baby Feeding: Bottle and Breast Disposition:home with mother   09/09/2015 [place] JEHIEL, DO

## 2015-09-12 ENCOUNTER — Other Ambulatory Visit: Payer: Medicaid Other

## 2015-09-15 ENCOUNTER — Other Ambulatory Visit: Payer: Medicaid Other

## 2015-10-13 ENCOUNTER — Ambulatory Visit (INDEPENDENT_AMBULATORY_CARE_PROVIDER_SITE_OTHER): Payer: Medicaid Other | Admitting: Family Medicine

## 2015-10-13 ENCOUNTER — Other Ambulatory Visit: Payer: Self-pay | Admitting: Family Medicine

## 2015-10-13 ENCOUNTER — Encounter: Payer: Self-pay | Admitting: Family Medicine

## 2015-10-13 DIAGNOSIS — Z3201 Encounter for pregnancy test, result positive: Secondary | ICD-10-CM

## 2015-10-13 DIAGNOSIS — Z202 Contact with and (suspected) exposure to infections with a predominantly sexual mode of transmission: Secondary | ICD-10-CM

## 2015-10-13 DIAGNOSIS — O10919 Unspecified pre-existing hypertension complicating pregnancy, unspecified trimester: Secondary | ICD-10-CM

## 2015-10-13 LAB — HCG, QUANTITATIVE, PREGNANCY: hCG, Beta Chain, Quant, S: 1 m[IU]/mL (ref <5)

## 2015-10-13 LAB — POCT PREGNANCY, URINE: Preg Test, Ur: POSITIVE — AB

## 2015-10-13 MED ORDER — NORETHIN ACE-ETH ESTRAD-FE 1-20 MG-MCG(24) PO TABS: 1.0000 | ORAL_TABLET | Freq: Every day | ORAL | Status: DC

## 2015-10-13 NOTE — Progress Notes (Signed)
Subjective:     Jasmine Baird is a 30 y.o. female who presents for a postpartum visit. She is 5 weeks postpartum following a spontaneous vaginal delivery. I have fully reviewed the prenatal and intrapartum course. The delivery was at 61 gestational weeks. Outcome: spontaneous vaginal delivery. Anesthesia: epidural. Postpartum course has been normal. Baby's course has been normal. Baby is feeding by bottle - Similac Advance. Bleeding staining only. Bowel function is normal. Bladder function is normal. Patient is not sexually active. Contraception method is OCP (estrogen/progesterone). Postpartum depression screening: negative.  Found out that husband has HSV.  Would like testing.  The following portions of the patient's history were reviewed and updated as appropriate: allergies, current medications, past family history, past medical history, past social history, past surgical history and problem list.  Review of Systems Pertinent items are noted in HPI.   Objective:    BP 128/88 mmHg  Pulse 76  Ht 5\' 1"  (1.549 m)  Wt 183 lb 11.2 oz (83.326 kg)  BMI 34.73 kg/m2  LMP 10/10/2015 (Exact Date)  Breastfeeding? No  General:  alert, cooperative and no distress  Lungs: clear to auscultation bilaterally  Heart:  regular rate and rhythm, S1, S2 normal, no murmur, click, rub or gallop  Abdomen: soft, non-tender; bowel sounds normal; no masses,  no organomegaly   Vulva:  normal  Vagina: not evaluated        Assessment:     Normal postpartum exam. CHTN.  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  Pap smear not done at today's visit.   Plan:    1. Contraception: OCP (estrogen/progesterone) 2. HSV IGg today 3.  UPT slightly positive.  Will check serum. 4. Follow up in: 1 year or as needed.

## 2015-10-13 NOTE — Addendum Note (Signed)
Addended by: Shelly Coss on: 10/13/2015 09:25 AM   Modules accepted: Orders

## 2015-10-13 NOTE — Addendum Note (Signed)
Addended by: Truett Mainland on: 10/13/2015 09:16 AM   Modules accepted: Orders

## 2015-10-16 LAB — HSV(HERPES SIMPLEX VRS) I + II AB-IGG
HSV 1 Glycoprotein G Ab, IgG: <0.9 Index (ref <0.90)
HSV 2 Glycoprotein G Ab, IgG: <0.9 Index (ref <0.90)

## 2015-10-17 ENCOUNTER — Encounter: Payer: Self-pay | Admitting: Family Medicine

## 2015-10-17 ENCOUNTER — Telehealth: Payer: Self-pay | Admitting: *Deleted

## 2015-10-17 NOTE — Telephone Encounter (Signed)
Pt left message yesterday requesting test results. I returned her call and informed her of negative Herpes test. We discussed this result as well as information about the transmission of the virus in detail. Pt voiced understanding.

## 2016-07-10 ENCOUNTER — Emergency Department (HOSPITAL_COMMUNITY)
Admission: EM | Admit: 2016-07-10 | Discharge: 2016-07-10 | Disposition: A | Discharge status: Home / Self Care | Payer: BLUE CROSS/BLUE SHIELD | Attending: Emergency Medicine | Admitting: Emergency Medicine

## 2016-07-10 ENCOUNTER — Encounter (HOSPITAL_COMMUNITY): Payer: Self-pay

## 2016-07-10 DIAGNOSIS — I10 Essential (primary) hypertension: Secondary | ICD-10-CM | POA: Insufficient documentation

## 2016-07-10 DIAGNOSIS — T7840XA Allergy, unspecified, initial encounter: Secondary | ICD-10-CM | POA: Diagnosis present

## 2016-07-10 DIAGNOSIS — T783XXA Angioneurotic edema, initial encounter: Secondary | ICD-10-CM | POA: Diagnosis not present

## 2016-07-10 DIAGNOSIS — Z79899 Other long term (current) drug therapy: Secondary | ICD-10-CM | POA: Diagnosis not present

## 2016-07-10 MED ORDER — DIPHENHYDRAMINE HCL 50 MG/ML IJ SOLN
50.0000 mg | Freq: Once | INTRAMUSCULAR | Status: AC
  Administered 2016-07-10: 50 mg via INTRAMUSCULAR
  Filled 2016-07-10: qty 1

## 2016-07-10 MED ORDER — PREDNISONE 20 MG PO TABS
60.0000 mg | ORAL_TABLET | Freq: Once | ORAL | Status: AC
  Administered 2016-07-10: 60 mg via ORAL
  Filled 2016-07-10: qty 3

## 2016-07-10 MED ORDER — PREDNISONE 20 MG PO TABS: ORAL_TABLET | ORAL | 0 refills | Status: DC

## 2016-07-10 NOTE — ED Provider Notes (Signed)
[facility] DEPT Provider Note   CSN: [id number] Arrival date & time: 07/10/16  0245     History   Chief Complaint Chief Complaint  Patient presents with  . Allergic Reaction    HPI Jasmine Baird is a 31 y.o. female.  Patient presents to the emergency department for evaluation of allergic reaction. Patient reports that around 7:00 last night she started having swelling of her upper lip. She reports that it slowly worsened through the evening and night. She has not had any tongue swelling, throat swelling, difficulty swallowing or difficulty breathing. There is no rash.      Past Medical History:  Diagnosis Date  . Hypertension     Patient Active Problem List   Diagnosis Date Noted  . Chronic hypertension in pregnancy 03/16/2015    Past Surgical History:  Procedure Laterality Date  . NO PAST SURGERIES      OB History    Gravida Para Term Preterm AB Living   3 2 1 1 1 2    SAB TAB Ectopic Multiple Live Births   0 1 0 0 2       Home Medications    Prior to Admission medications   Medication Sig Start Date End Date Taking? Authorizing Provider  hydrochlorothiazide (MICROZIDE) 12.5 MG capsule Take 1 capsule (12.5 mg total) by mouth daily. 09/09/15  Yes Tanna Savoy Stinson, DO  norgestimate-ethinyl estradiol (ORTHO-CYCLEN,SPRINTEC,PREVIFEM) 0.25-35 MG-MCG tablet Take 1 tablet by mouth daily.   Yes Historical Provider, MD  Norethindrone Acetate-Ethinyl Estrad-FE (LOESTRIN 24 FE) 1-20 MG-MCG(24) tablet Take 1 tablet by mouth daily. Patient not taking: Reported on 07/10/2016 10/13/15   Tanna Savoy Stinson, DO  predniSONE (DELTASONE) 20 MG tablet 3 tabs po daily x 3 days, then 2 tabs x 3 days, then 1.5 tabs x 3 days, then 1 tab x 3 days, then 0.5 tabs x 3 days 07/10/16   Orpah Greek, MD    Family History Family History  Problem Relation Age of Onset  . Hypertension Mother   . Hypertension Father   . Hyperlipidemia Father   . Heart disease Maternal Uncle   .  Diabetes Maternal Grandmother   . Hypertension Maternal Grandmother   . Asthma Maternal Grandmother   . Depression Maternal Grandmother   . Birth defects Maternal Grandmother   . Cancer Maternal Grandfather 19    PANCREATIC  . Birth defects Paternal Uncle   . Cancer Paternal Uncle     Social History Social History  Substance Use Topics  . Smoking status: Never Smoker  . Smokeless tobacco: Never Used  . Alcohol use No     Allergies   Patient has no known allergies.   Review of Systems Review of Systems  HENT: Positive for facial swelling.   All other systems reviewed and are negative.    Physical Exam Updated Vital Signs BP (!) 126/94 (BP Location: Left Arm)   Pulse 85   Temp 98.3 F (36.8 C) (Oral)   Resp 19   LMP 06/12/2016   SpO2 100%   Physical Exam  Constitutional: She is oriented to person, place, and time. She appears well-developed and well-nourished. No distress.  HENT:  Head: Normocephalic and atraumatic.  Right Ear: Hearing normal.  Left Ear: Hearing normal.  Nose: Nose normal.  Mouth/Throat: Oropharynx is clear and moist and mucous membranes are normal.  Symmetrical edema of upper lip  Eyes: Conjunctivae and EOM are normal. Pupils are equal, round, and reactive to light.  Neck: Normal  range of motion. Neck supple.  Cardiovascular: Regular rhythm, S1 normal and S2 normal.  Exam reveals no gallop and no friction rub.   No murmur heard. Pulmonary/Chest: Effort normal and breath sounds normal. No respiratory distress. She exhibits no tenderness.  Abdominal: Soft. Normal appearance and bowel sounds are normal. There is no hepatosplenomegaly. There is no tenderness. There is no rebound, no guarding, no tenderness at McBurney's point and negative Murphy's sign. No hernia.  Musculoskeletal: Normal range of motion.  Neurological: She is alert and oriented to person, place, and time. She has normal strength. No cranial nerve deficit or sensory deficit.  Coordination normal. GCS eye subscore is 4. GCS verbal subscore is 5. GCS motor subscore is 6.  Skin: Skin is warm, dry and intact. No rash noted. No cyanosis.  Psychiatric: She has a normal mood and affect. Her speech is normal and behavior is normal. Thought content normal.  Nursing note and vitals reviewed.    ED Treatments / Results  Labs (all labs ordered are listed, but only abnormal results are displayed) Labs Reviewed - No data to display  EKG  EKG Interpretation None       Radiology No results found.  Procedures Procedures (including critical care time)  Medications Ordered in ED Medications  predniSONE (DELTASONE) tablet 60 mg (60 mg Oral Given 07/10/16 0501)  diphenhydrAMINE (BENADRYL) injection 50 mg (50 mg Intramuscular Given 07/10/16 0501)     Initial Impression / Assessment and Plan / ED Course  I have reviewed the triage vital signs and the nursing notes.  Pertinent labs & imaging results that were available during my care of the patient were reviewed by me and considered in my medical decision making (see chart for details).     Patient presents to the emergency part with upper lip angioedema secondary to lisinopril use. Patient administered prednisone and Benadryl, monitored. She has not had improvement but there has not been any worsening. Symptoms started 11 hours ago. She has not had any airway compromise. It is felt that she is safe for discharge, discontinue Prinivil. Follow-up with primary doctor for recheck of blood pressure.  Final Clinical Impressions(s) / ED Diagnoses   Final diagnoses:  Angioedema, initial encounter    New Prescriptions New Prescriptions   PREDNISONE (DELTASONE) 20 MG TABLET    3 tabs po daily x 3 days, then 2 tabs x 3 days, then 1.5 tabs x 3 days, then 1 tab x 3 days, then 0.5 tabs x 3 days     Orpah Greek, MD 07/10/16 [phone]

## 2016-07-10 NOTE — ED Triage Notes (Signed)
Pt states she woke up with a swollen lip, no change in medication, soaps or food Pt states she has been on lisinopril for about two years

## 2016-07-10 NOTE — Discharge Instructions (Signed)
Stop Lisinopril, follow up with PCP for recheck of blood pressure.

## 2016-07-10 NOTE — ED Notes (Signed)
Bed: WTR8 Expected date:  Expected time:  Means of arrival:  Comments: 

## 2016-11-26 IMAGING — US US MFM FETAL NUCHAL TRANSLUCENCY
2 series · 14 of 28 positions shown · non-contrast
Comparison: none

[Series 1: us mfm fetal nuchal translucency · 13 of 31 slices shown (1 of 2)]
[im 2/31]
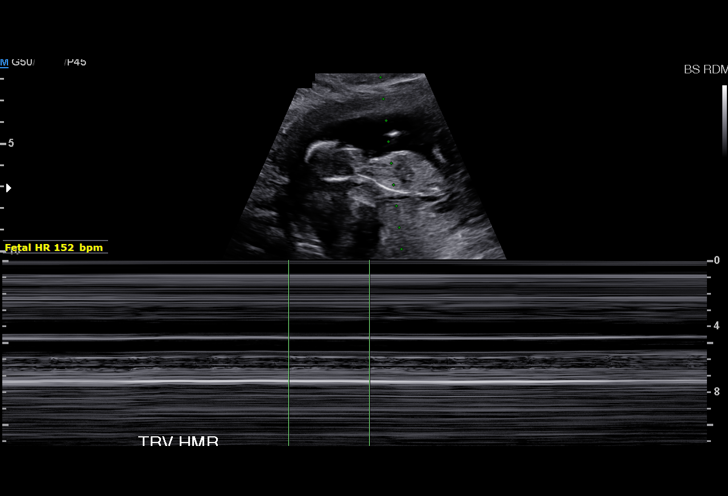
[im 4/31]
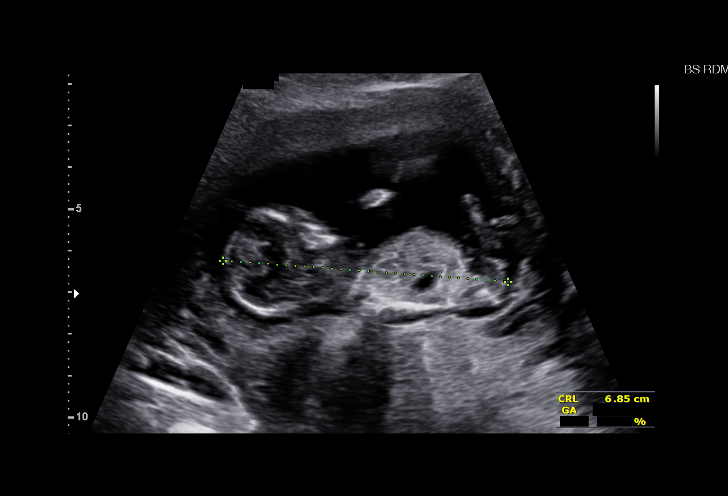
[im 7/31]
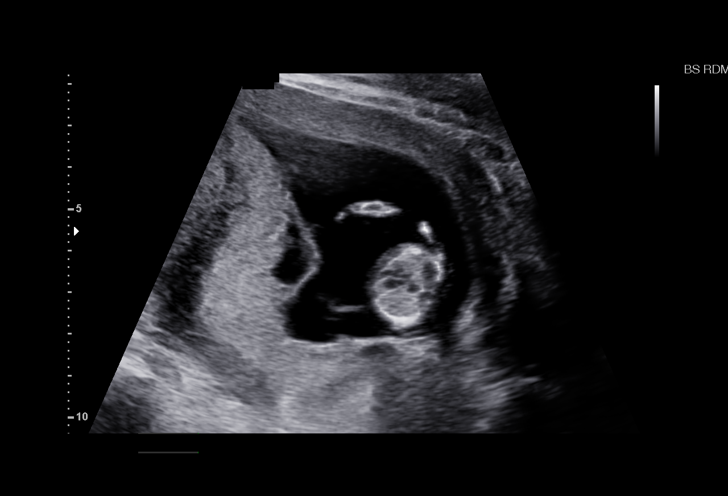
[im 9/31]
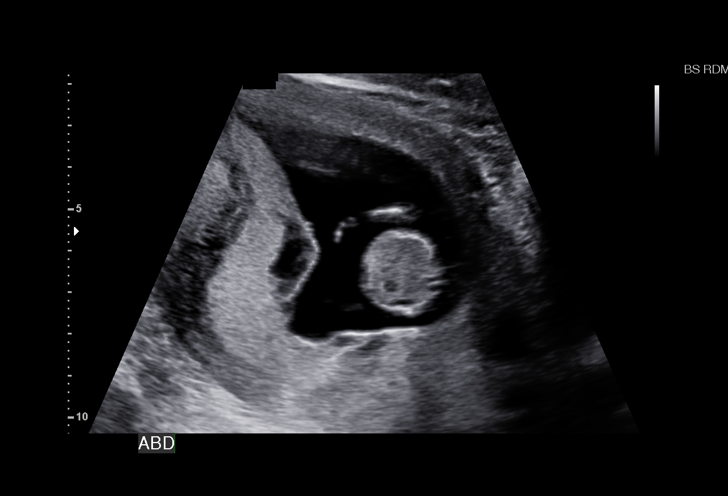
[im 11/31]
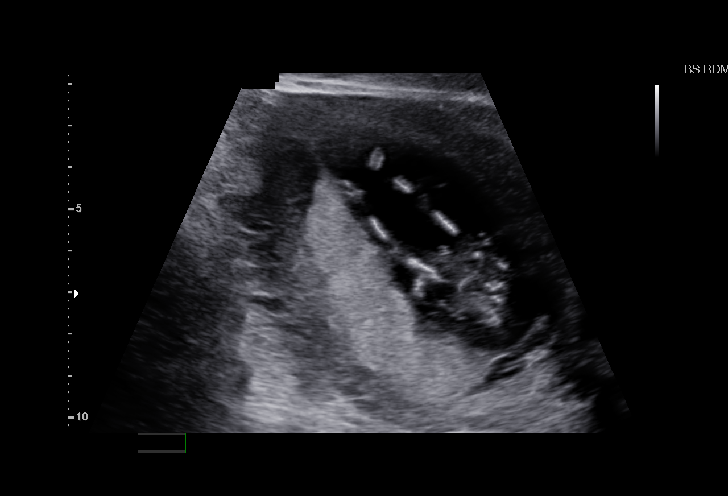
[im 14/31]
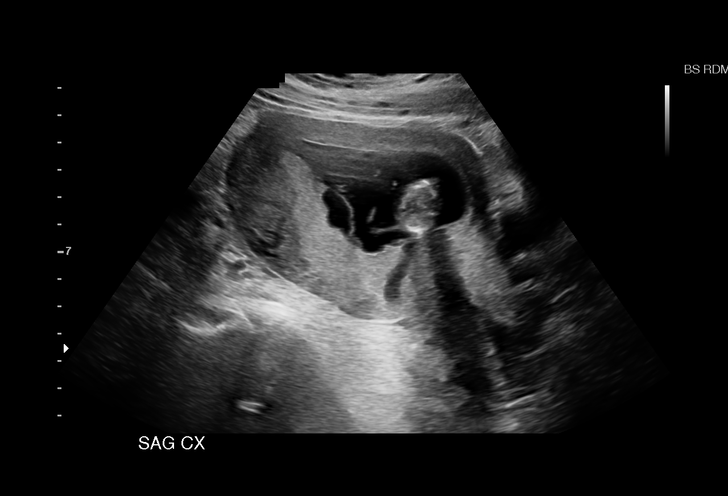
[im 16/31]
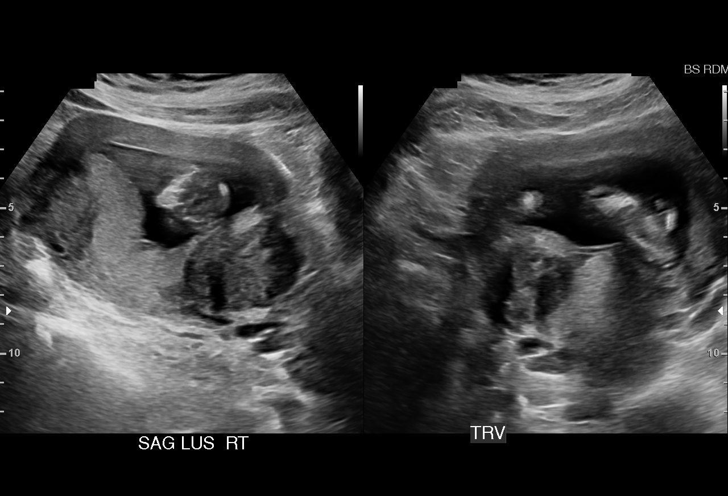
[im 19/31]
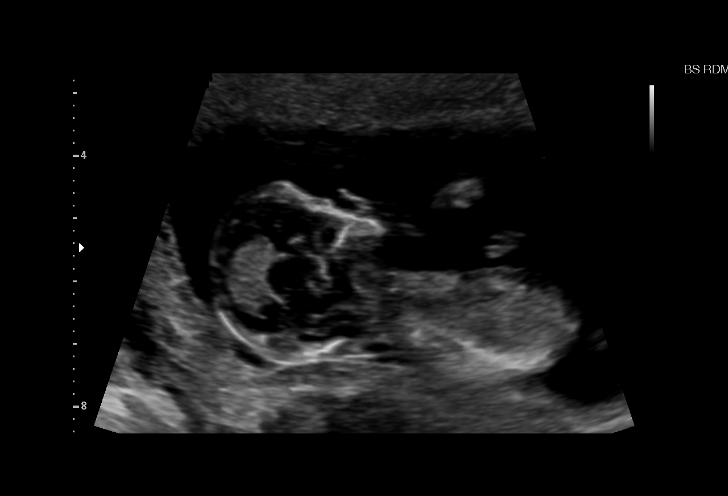
[im 21/31]
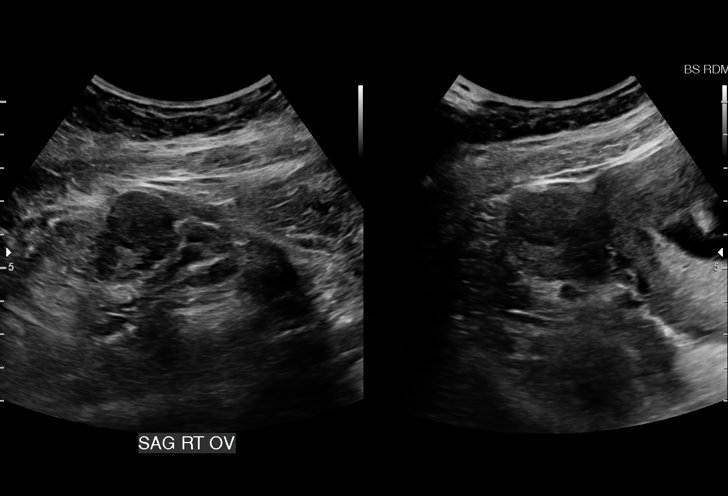
[im 23/31]
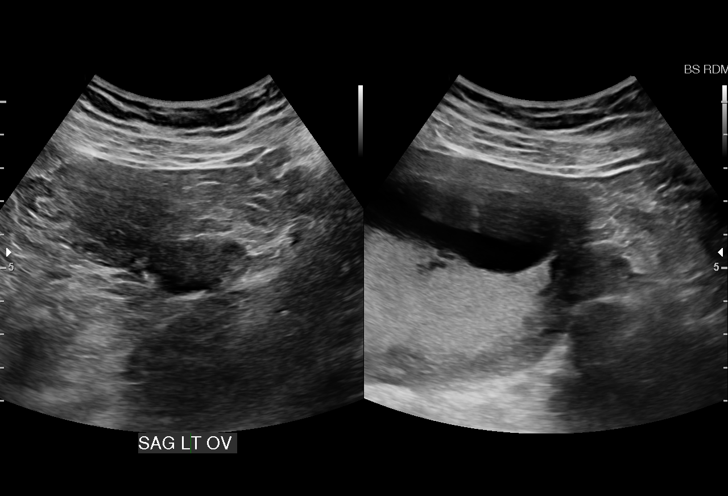
[im 26/31]
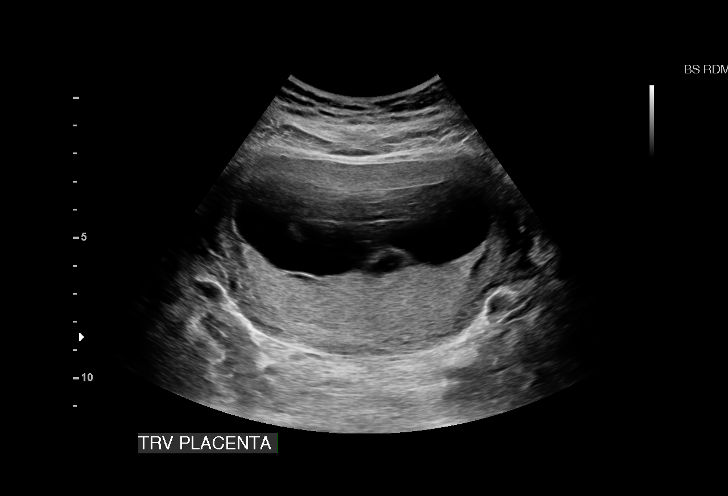
[im 28/31]
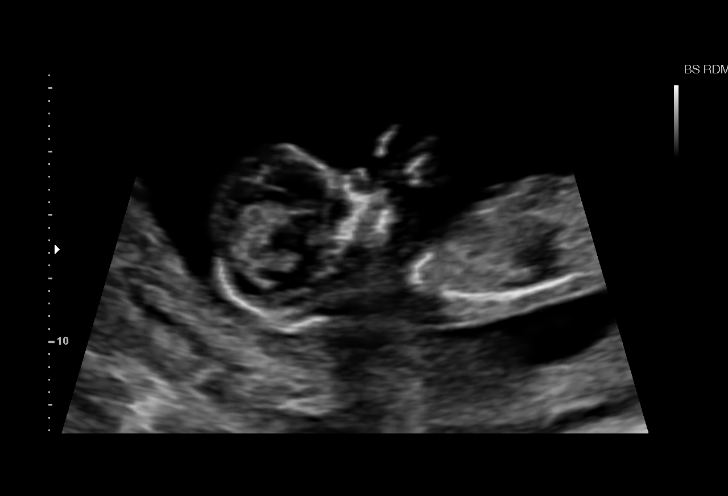
[im 31/31]
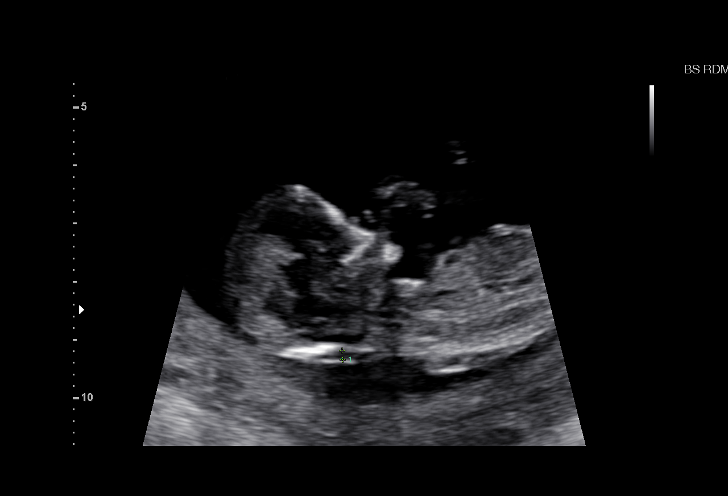

[Series 3: us mfm fetal nuchal translucency · 1 of 2 slices shown (2 of 2)]
[im 2/2]
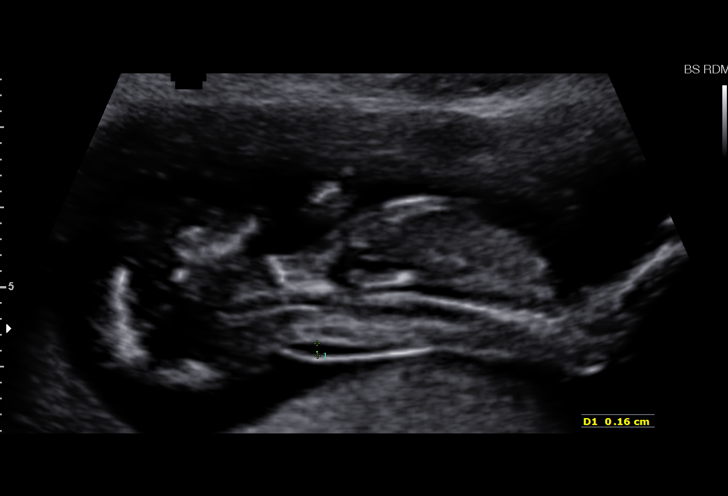

[14 of 28 positions shown; findings below may reference images not displayed]

am)

Date:

[REDACTED]-
Faculty Physician

TRANSLUCENCY

1  MAIKEL KRAL           [id number]        [id number]     [id number]
Indications

First trimester aneuploidy screen (NT)          Z36
Hypertension - Chronic/Pre-existing
Poor obstetric history: Previous
preeclampsia / eclampsia/gestational HTN
Uterine fibroids
Poor obstetric history: Previous preterm
delivery, antepartum (33 weeks)
13 weeks gestation of pregnancy
OB History

Height:        5'4"   Weight:   155        BMI:
Gravidity:     3         Term:  0        Prem:    1        SAB:   0
TOP:           1       Ectopic  0        Living:  1
:
Fetal Evaluation

Num Of Fetuses:      1
Fetal Heart          152
Rate(bpm):
Cardiac Activity:    Observed
Presentation:        Transverse, head to maternal right
Placenta:            Posterior
P. Cord Insertion:   Visualized, central
Amniotic Fluid
AFI FV:      Subjectively within normal limits
Larg Pckt:      4.7  cm
Biometry

CRL:      69.2  mm     G. Age:   13w 0d                  EDD:   09/20/15

NT:        1.6  mm
Gestational Age

LMP:           13w 1d        Date:  12/13/14                  EDD:   09/19/15
Best:          13w 1d    Det. By:   LMP  (12/13/14)           EDD:   09/19/15
1st Trimester Genetic Sonogram Screening

Nuc Trans:       1.6  mm

Nasal Bone:                  Present
Cervix Uterus Adnexa

Cervix
Normal appearance by transabdominal scan.

Uterus
Single fibroid noted, see table below.

Left Ovary
Within normal limits.

Right Ovary
Within normal limits.

Adnexa:       No abnormality visualized.
Myomas

Site                     L(cm)      W(cm)       D(cm)      Location
Rt LUS

Blood Flow                  RI       PI        Comments

Impression

Single IUP at 13w 1d
Normal NT (1.6 mm).  Nasal bone visualized.
Right lower uterine segment myoma noted as described
above
First trimester aneuploidy screen performed as noted
above.  Please do not draw triple/quad screen, though
patient should be offered MSAFP for neural tube defect
screening.
Recommendations
Recommend ultrasound for fetal anatomy at 18 weeks

## 2017-04-22 IMAGING — US US MFM FETAL BPP W/O NON-STRESS
1 series · 14 of 28 positions shown · non-contrast
Comparison: none

[Series 1: us mfm fetal bpp w/o non-stress · 64 acquisitions, 14 frames shown]
[im 3/64]
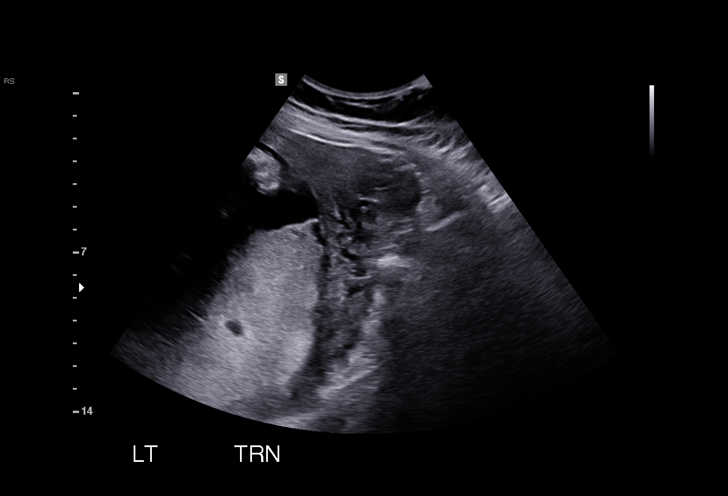
[im 8/64]
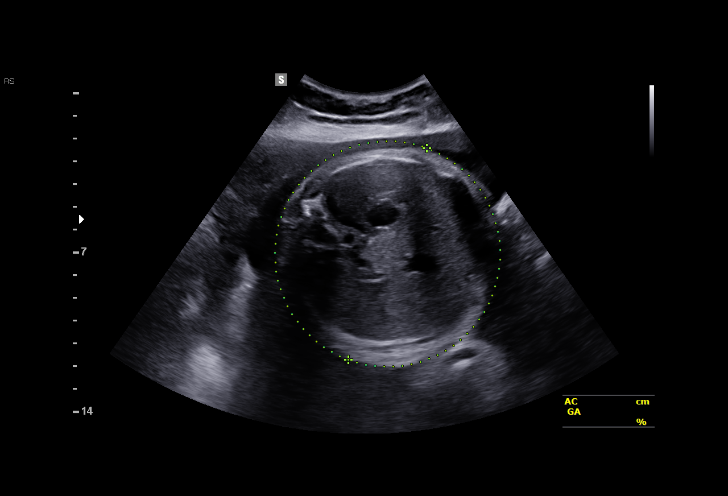
[im 12/64]
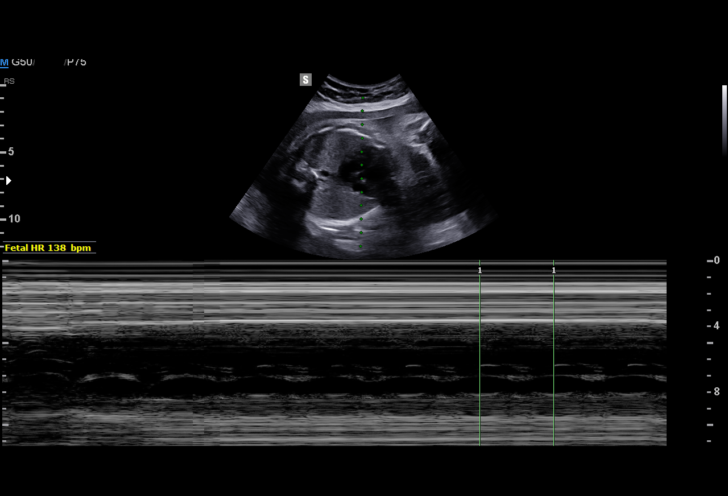
[im 17/64]
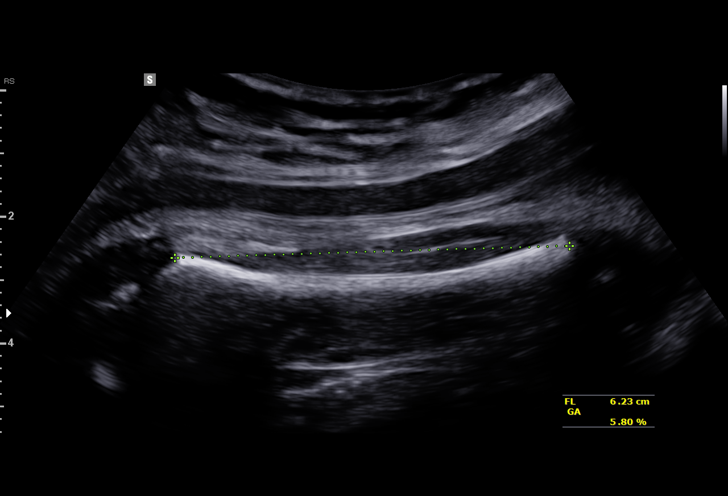
[im 22/64]
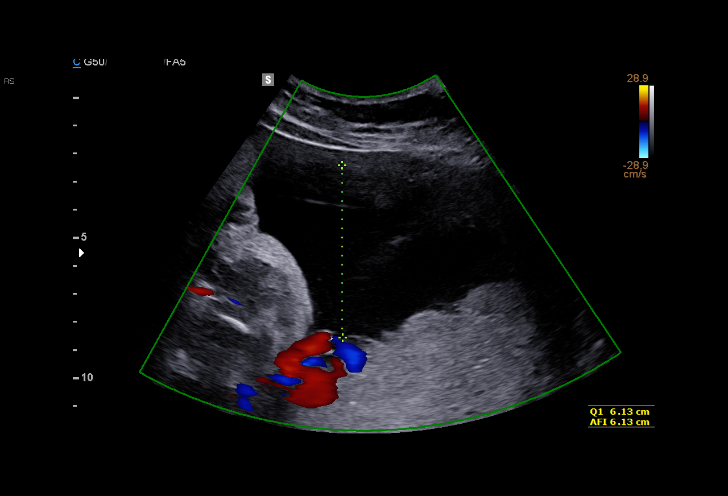
[im 26/64]
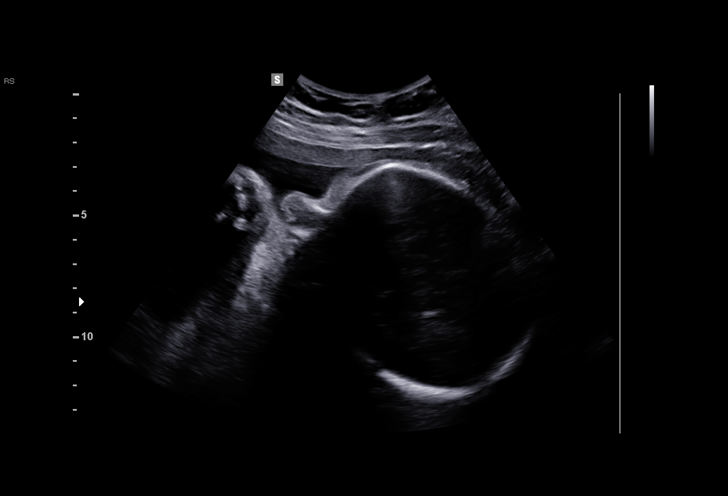
[im 31/64]
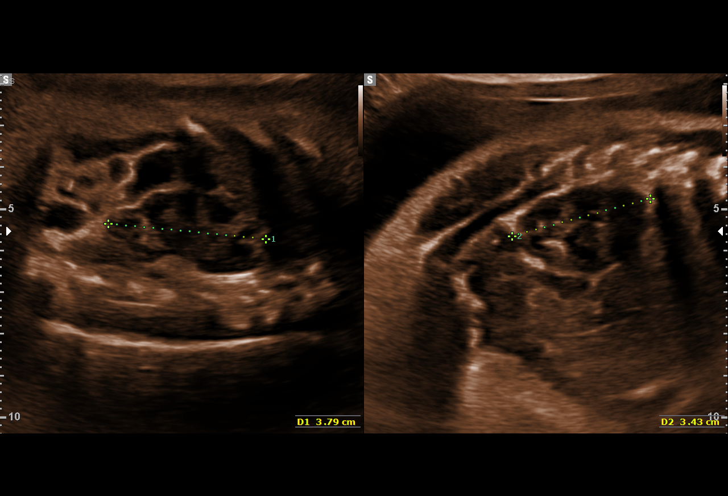
[im 36/64]
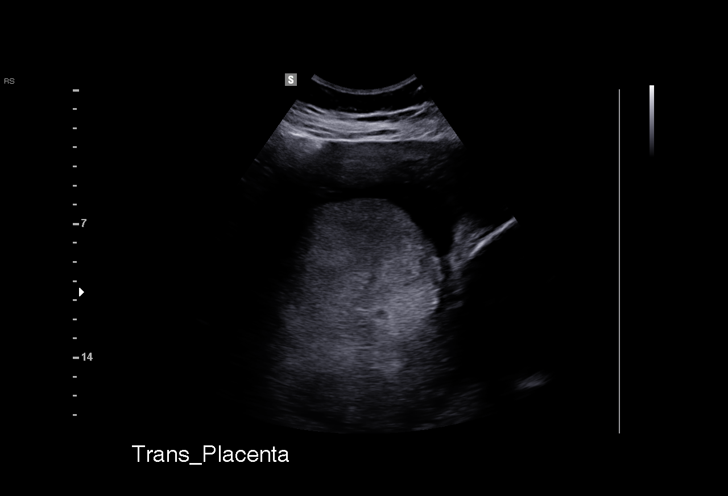
[im 40/64]
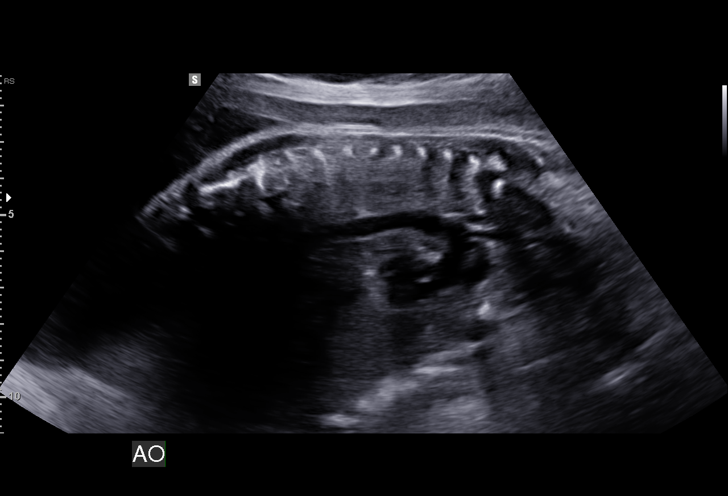
[im 45/64]
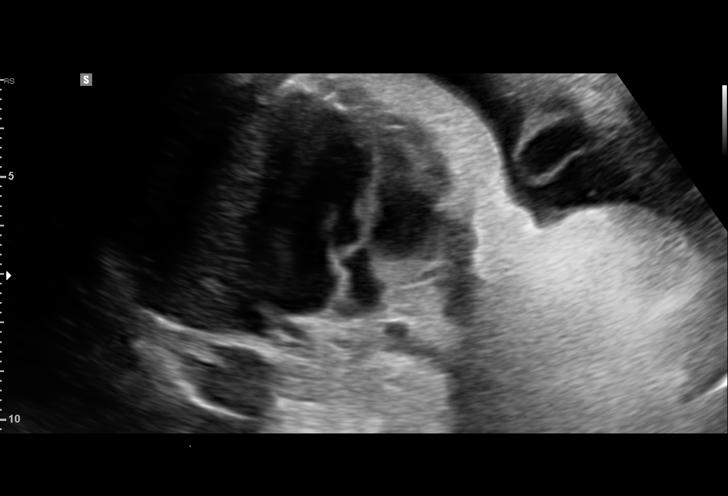
[im 50/64]
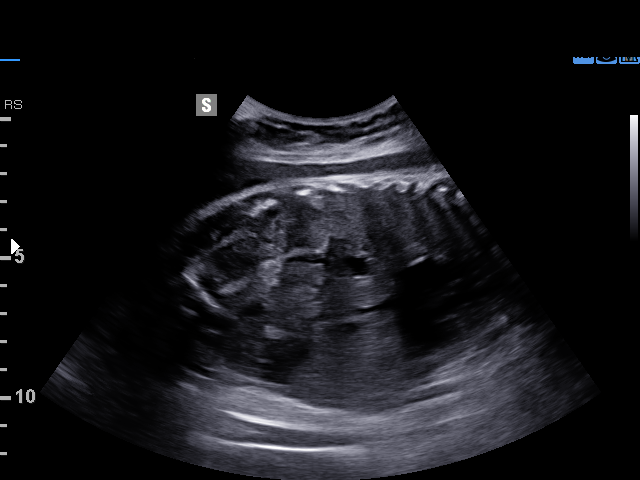
[im 54/64]
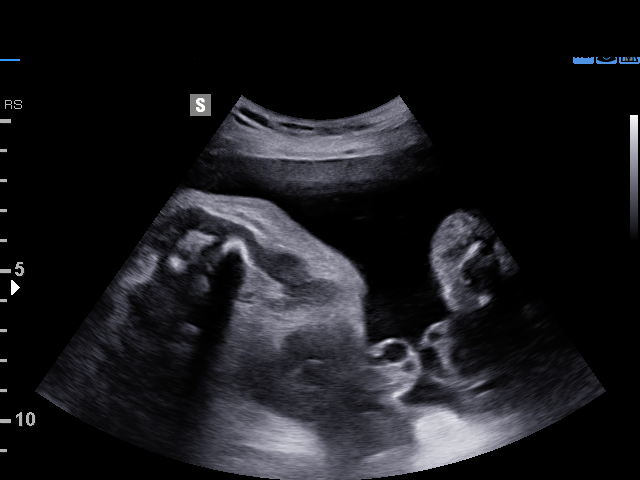
[im 59/64]
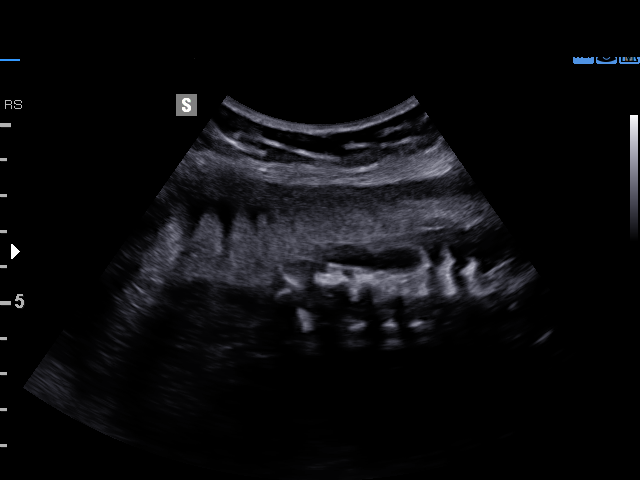
[im 64/64]
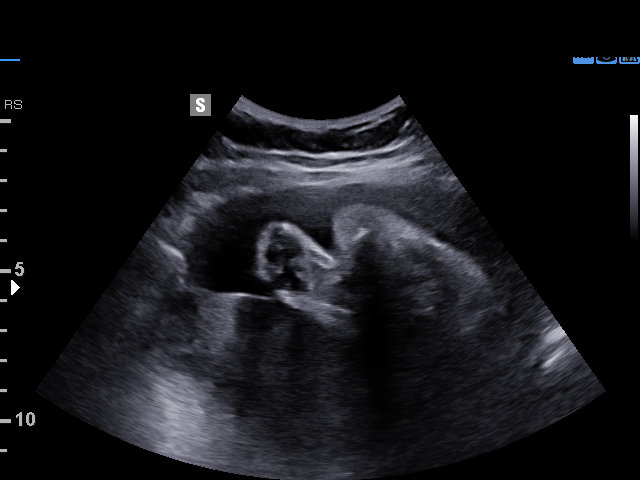

[14 of 28 positions shown; findings below may reference images not displayed]

[facility]
[REDACTED]-
Faculty Physician

1  LUSINE JIM                [id number]      [id number]     [id number]
2  PAULUS N CEEJAY           [id number]      [id number]     [id number]
Indications

34 weeks gestation of pregnancy
Pre-existing essential hypertension
complicating pregnancy, third trimester
Poor obstetric history: Previous
preeclampsia / eclampsia/gestational HTN
Poor obstetric history: Previous preterm
delivery, antepartum (33 weeks)
Uterine fibroids affecting pregnancy in third  O34.13,
trimester, antepartum
OB History

Blood Type:            Height:  5'4"   Weight (lb):  155      BMI:
Gravidity:    3         Term:   0        Prem:   1        SAB:   0
TOP:          1       Ectopic:  0        Living: 1
Fetal Evaluation

Num Of Fetuses:     1
Fetal Heart         138
Rate(bpm):
Cardiac Activity:   Observed
Presentation:       Cephalic
Placenta:           Posterior, above cervical os
Amniotic Fluid
AFI FV:      Subjectively within normal limits

AFI Sum(cm)     %Tile       Largest Pocket(cm)
19.21           71

RUQ(cm)       RLQ(cm)       LUQ(cm)        LLQ(cm)
6.13
Biophysical Evaluation

Amniotic F.V:   Pocket => 2 cm two         F. Tone:        Observed
planes
F. Movement:    Observed                   N.S.T:          Reactive
F. Breathing:   Not Observed               Score:          [DATE]
Biometry

BPD:      85.3  mm     G. Age:  34w 2d         55  %    CI:        74.29   %   70 - 86
FL/HC:      19.8   %   19.4 -
HC:      314.2  mm     G. Age:  35w 2d         42  %    HC/AC:      1.01       0.96 -
AC:      311.1  mm     G. Age:  35w 0d         78  %    FL/BPD:     73.0   %   71 - 87
FL:       62.3  mm     G. Age:  32w 2d          7  %    FL/AC:      20.0   %   20 - 24
HUM:      56.2  mm     G. Age:  32w 5d         28  %

Est. FW:    6465  gm      5 lb 4 oz     62  %
Gestational Age

LMP:           34w 1d       Date:   12/13/14                 EDD:   09/19/15
U/S Today:     34w 2d                                        EDD:   09/18/15
Best:          34w 1d    Det. By:   LMP  (12/13/14)          EDD:   09/19/15
Anatomy

Cranium:               Previously seen        Aortic Arch:            Appears normal
Cavum:                 Previously seen        Ductal Arch:            Previously seen
Ventricles:            Appears normal         Diaphragm:              Previously seen
Choroid Plexus:        Previously seen        Stomach:                Appears normal, left
sided
Cerebellum:            Previously seen        Abdomen:                Previously seen
Posterior Fossa:       Previously seen        Abdominal Wall:         Previously seen
Nuchal Fold:           Previously seen        Cord Vessels:           Previously seen
Face:                  Orbits and profile     Kidneys:                Appear normal
previously seen
Lips:                  Previously seen        Bladder:                Appears normal
Thoracic:              Previously seen        Spine:                  Previously seen
Heart:                 Appears normal         Upper Extremities:      Previously seen
(4CH, axis, and
situs)
RVOT:                  Previously seen        Lower Extremities:      Previously seen
LVOT:                  Previously seen

Other:  Heels and 5th digit visualized previously. Nasal bone visualized
previously. Technically difficult due to fetal position.  Fetus appears to
be a female.
Cervix Uterus Adnexa
Cervix
Not visualized (advanced GA >79wks)

Uterus
Single fibroid noted previously seen

Left Ovary
Within normal limits.

Right Ovary
Not visualized.

Adnexa:       No abnormality visualized. No adnexal mass
visualized.
Impression

Single living intrauterine pregnancy at 34 weeks 1 day.
Appropriate interval fetal growth (62%).
Normal amniotic fluid volume.
Normal interval fetal anatomy.
BPP [DATE] (-2 for lack of fetal breathing).
Recommendations

Continue antenatal fetal testing.

## 2017-11-24 ENCOUNTER — Emergency Department (HOSPITAL_COMMUNITY): Payer: Medicaid Other

## 2017-11-24 ENCOUNTER — Other Ambulatory Visit: Payer: Self-pay

## 2017-11-24 ENCOUNTER — Encounter (HOSPITAL_COMMUNITY): Payer: Self-pay | Admitting: Obstetrics and Gynecology

## 2017-11-24 ENCOUNTER — Emergency Department (HOSPITAL_COMMUNITY)
Admission: EM | Admit: 2017-11-24 | Discharge: 2017-11-24 | Disposition: A | Discharge status: Home / Self Care | Payer: Medicaid Other | Attending: Emergency Medicine | Admitting: Emergency Medicine

## 2017-11-24 DIAGNOSIS — J40 Bronchitis, not specified as acute or chronic: Secondary | ICD-10-CM | POA: Insufficient documentation

## 2017-11-24 DIAGNOSIS — R062 Wheezing: Secondary | ICD-10-CM

## 2017-11-24 DIAGNOSIS — I10 Essential (primary) hypertension: Secondary | ICD-10-CM | POA: Diagnosis not present

## 2017-11-24 DIAGNOSIS — R05 Cough: Secondary | ICD-10-CM | POA: Diagnosis present

## 2017-11-24 MED ORDER — HYDROCHLOROTHIAZIDE 12.5 MG PO CAPS: 12.5000 mg | ORAL_CAPSULE | Freq: Every day | ORAL | 1 refills | Status: DC

## 2017-11-24 MED ORDER — PREDNISONE 20 MG PO TABS
60.0000 mg | ORAL_TABLET | Freq: Once | ORAL | Status: AC
  Administered 2017-11-24: 60 mg via ORAL
  Filled 2017-11-24: qty 3

## 2017-11-24 MED ORDER — AZITHROMYCIN 250 MG PO TABS: 250.0000 mg | ORAL_TABLET | Freq: Every day | ORAL | 0 refills | Status: AC

## 2017-11-24 MED ORDER — ALBUTEROL SULFATE HFA 108 (90 BASE) MCG/ACT IN AERS: 1.0000 | INHALATION_SPRAY | Freq: Four times a day (QID) | RESPIRATORY_TRACT | 0 refills | Status: DC | PRN

## 2017-11-24 MED ORDER — IPRATROPIUM-ALBUTEROL 0.5-2.5 (3) MG/3ML IN SOLN
3.0000 mL | Freq: Once | RESPIRATORY_TRACT | Status: AC
  Administered 2017-11-24: 3 mL via RESPIRATORY_TRACT
  Filled 2017-11-24: qty 3

## 2017-11-24 MED ORDER — AZITHROMYCIN 250 MG PO TABS
500.0000 mg | ORAL_TABLET | Freq: Once | ORAL | Status: AC
  Administered 2017-11-24: 500 mg via ORAL
  Filled 2017-11-24: qty 2

## 2017-11-24 MED ORDER — PREDNISONE 10 MG (21) PO TBPK: ORAL_TABLET | Freq: Every day | ORAL | 0 refills | Status: DC

## 2017-11-24 NOTE — ED Notes (Signed)
Lisa RT contacted to start neb tx as ordered

## 2017-11-24 NOTE — Discharge Instructions (Signed)
You were seen in the emergency department for shortness of breath, cough, chest congestion.  Your chest x-ray was normal.  Symptoms improved with breathing treatment and prednisone.  Chest x-ray did not have any signs of pneumonia however your heart rate and breathing rate are elevated and you had some wheezing on exam so we will treat you for possible, early pneumonia with azithromycin.  Take this medication as prescribed.  Use albuterol and prednisone as prescribed.  Take any over-the-counter antitussive medication.  Return to the ER for worsening shortness of breath, fevers.

## 2017-11-24 NOTE — ED Triage Notes (Signed)
Pt reports she gets a cold yearly that goes to her chest. PT has a strong cough and reports she cannot walk long distances without getting SOB.  Pt denies chest pain at this time.

## 2017-11-24 NOTE — ED Provider Notes (Signed)
[facility] DEPT Provider Note   CSN: [id number] Arrival date & time: 11/24/17  1306     History   Chief Complaint Chief Complaint  Patient presents with  . Cough  . Nasal Congestion    HPI Jasmine Baird is a 32 y.o. female with history of hypertension is here for evaluation of cough.  Cough is described as wet sounding, productive that began 2 to 3 days ago.  She had preceding heavy sinus drainage that she thinks has settled into her chest causing a chest cold.  States typically once a year she gets similar symptoms and she needs breathing treatments and antibiotics for bronchitis and pneumonia.  Associate symptoms include chest discomfort described as tightness, wheezing worse at night, back and stomach pains with forceful coughing, shortness of breath with talking and walking.  Shortness of breath is described as "cannot catch my breath" and feeling more winded.  These are her classic symptoms that she gets with her bronchitis/pneumonia.  She tried Mucinex that did not help.  No associated fevers, chills, sweats, sore throat, nasal congestion, nausea, vomiting, abdominal pain, exertional chest pain.  No history of asthma or tobacco use.   HPI  Past Medical History:  Diagnosis Date  . Hypertension     Patient Active Problem List   Diagnosis Date Noted  . Chronic hypertension in pregnancy 03/16/2015    Past Surgical History:  Procedure Laterality Date  . NO PAST SURGERIES       OB History    Gravida  3   Para  2   Term  1   Preterm  1   AB  1   Living  2     SAB  0   TAB  1   Ectopic  0   Multiple  0   Live Births  2            Home Medications    Prior to Admission medications   Medication Sig Start Date End Date Taking? Authorizing Provider  chlorthalidone (HYGROTON) 25 MG tablet Take 25 mg by mouth daily. 11/19/17  Yes [provider]  albuterol (PROVENTIL HFA;VENTOLIN HFA) 108 (90 Base) MCG/ACT  inhaler Inhale 1-2 puffs into the lungs every 6 (six) hours as needed for wheezing or shortness of breath. 11/24/17   Kinnie Feil, PA-C  azithromycin (ZITHROMAX Z-PAK) 250 MG tablet Take 1 tablet (250 mg total) by mouth daily for 4 doses. 11/24/17 11/28/17  Kinnie Feil, PA-C  hydrochlorothiazide (MICROZIDE) 12.5 MG capsule Take 1 capsule (12.5 mg total) by mouth daily. 11/24/17   Kinnie Feil, PA-C  Norethindrone Acetate-Ethinyl Estrad-FE (LOESTRIN 24 FE) 1-20 MG-MCG(24) tablet Take 1 tablet by mouth daily. Patient not taking: Reported on 07/10/2016 10/13/15   Truett Mainland, DO  predniSONE (STERAPRED UNI-PAK 21 TAB) 10 MG (21) TBPK tablet Take by mouth daily. Take 6 tabs by mouth daily  for 2 days, then 5 tabs for 2 days, then 4 tabs for 2 days, then 3 tabs for 2 days, 2 tabs for 2 days, then 1 tab by mouth daily for 2 days 11/24/17   Kinnie Feil, PA-C    Family History Family History  Problem Relation Age of Onset  . Hypertension Mother   . Hypertension Father   . Hyperlipidemia Father   . Heart disease Maternal Uncle   . Diabetes Maternal Grandmother   . Hypertension Maternal Grandmother   . Asthma Maternal Grandmother   .  Depression Maternal Grandmother   . Birth defects Maternal Grandmother   . Cancer Maternal Grandfather 82       PANCREATIC  . Birth defects Paternal Uncle   . Cancer Paternal Uncle     Social History Social History   Tobacco Use  . Smoking status: Never Smoker  . Smokeless tobacco: Never Used  Substance Use Topics  . Alcohol use: No  . Drug use: No     Allergies   Patient has no known allergies.   Review of Systems Review of Systems  Respiratory: Positive for cough, chest tightness, shortness of breath and wheezing.   All other systems reviewed and are negative.    Physical Exam Updated Vital Signs BP (!) 144/100 (BP Location: Left Arm)   Pulse 85   Temp 99.6 F (37.6 C) (Oral)   Resp 18   Ht 5\' 1"  (1.549 m)   Wt 73 kg    LMP 11/19/2017 (Exact Date)   SpO2 100%   BMI 30.42 kg/m   Physical Exam  Constitutional: She is oriented to person, place, and time. She appears well-developed and well-nourished. No distress.  NAD.  Nontoxic.  HENT:  Head: Normocephalic and atraumatic.  Right Ear: External ear normal.  Left Ear: External ear normal.  Nose: Nose normal.  Sounds congested.  Oropharynx is erythematous.  No tonsillar edema, hypertrophy, exudates.  Uvula midline.  No trismus.  Moist mucous membranes.  Clear rhinorrhea noted.  Eyes: Conjunctivae and EOM are normal.  Neck: Normal range of motion. Neck supple.  Cardiovascular: Normal rate, regular rhythm and normal heart sounds.  No murmur heard. Pulmonary/Chest: Effort normal. She has wheezes.  Expiratory wheezing in all lung fields anteriorly and posteriorly.  Normal work of breathing.    Musculoskeletal: Normal range of motion. She exhibits no deformity.  Neurological: She is alert and oriented to person, place, and time.  Skin: Skin is warm and dry. Capillary refill takes less than 2 seconds.  Psychiatric: She has a normal mood and affect. Her behavior is normal. Judgment and thought content normal.  Nursing note and vitals reviewed.    ED Treatments / Results  Labs (all labs ordered are listed, but only abnormal results are displayed) Labs Reviewed - No data to display  EKG None  Radiology Dg Chest 2 View  Result Date: 11/24/2017 CLINICAL DATA:  Cough, congestion, hoarseness, and dyspnea on exertion for 1 week EXAM: CHEST - 2 VIEW COMPARISON:  None FINDINGS: Normal heart size, mediastinal contours, and pulmonary vascularity. Lungs clear. No pleural effusion or pneumothorax. Bones unremarkable. IMPRESSION: Normal exam. Electronically Signed   By: Lavonia Dana M.D.   On: 11/24/2017 14:40    Procedures Procedures (including critical care time)  Medications Ordered in ED Medications  ipratropium-albuterol (DUONEB) 0.5-2.5 (3) MG/3ML  nebulizer solution 3 mL (3 mLs Nebulization Given 11/24/17 1501)  predniSONE (DELTASONE) tablet 60 mg (60 mg Oral Given 11/24/17 1459)  ipratropium-albuterol (DUONEB) 0.5-2.5 (3) MG/3ML nebulizer solution 3 mL (3 mLs Nebulization Given 11/24/17 1624)  azithromycin (ZITHROMAX) tablet 500 mg (500 mg Oral Given 11/24/17 1655)     Initial Impression / Assessment and Plan / ED Course  I have reviewed the triage vital signs and the nursing notes.  Pertinent labs & imaging results that were available during my care of the patient were reviewed by me and considered in my medical decision making (see chart for details).    32 year old female with history of hypertension presents with signs and symptoms suggestive of  URI illness versus bronchitis versus pneumonia.  On exam she is mildly tachycardic, tachypneic but with no fever, hypoxia.  Overall nontoxic-appearing.  No respiratory distress.  She has expiratory wheezing in all lung fields.  Chest x-ray was normal.  Wheezing, symptoms and vital signs significantly improved after DuoNeb's x2 and prednisone.  Patient feels much better.  She ambulated without hypoxia or shortness of breath.  I suspect she has bronchitis with bronchospasm however given initial vital signs and lung exam with previous history of similar symptoms in the last few years requiring antibiotics, there is concern for early pneumonia with radiographic delay so we will treat with azithromycin, albuterol, prednisone and antitussive.  She is a low risk patient for outpatient treatment for CAP.  Discussed return precautions.  She is aware of symptoms that would warrant return to the ER  Final Clinical Impressions(s) / ED Diagnoses   Final diagnoses:  Bronchitis  Wheezing    ED Discharge Orders         Ordered    hydrochlorothiazide (MICROZIDE) 12.5 MG capsule  Daily     11/24/17 1642    predniSONE (STERAPRED UNI-PAK 21 TAB) 10 MG (21) TBPK tablet  Daily     11/24/17 1642    albuterol  (PROVENTIL HFA;VENTOLIN HFA) 108 (90 Base) MCG/ACT inhaler  Every 6 hours PRN     11/24/17 1642    azithromycin (ZITHROMAX Z-PAK) 250 MG tablet  Daily     11/24/17 1642           Kinnie Feil, [place] 11/24/17 1707    Sherwood Gambler, MD 11/24/17 1739

## 2017-11-24 NOTE — ED Notes (Signed)
Sats while ambulating was 95% RA

## 2017-11-24 NOTE — ED Notes (Signed)
EKG completed

## 2019-08-08 IMAGING — CR DG CHEST 2V
2 series · 2 of 2 positions shown · non-contrast
Comparison: None

CLINICAL DATA: Cough, congestion, hoarseness, and dyspnea on
exertion for 1 week

EXAM:
CHEST - 2 VIEW

[w chest pa]
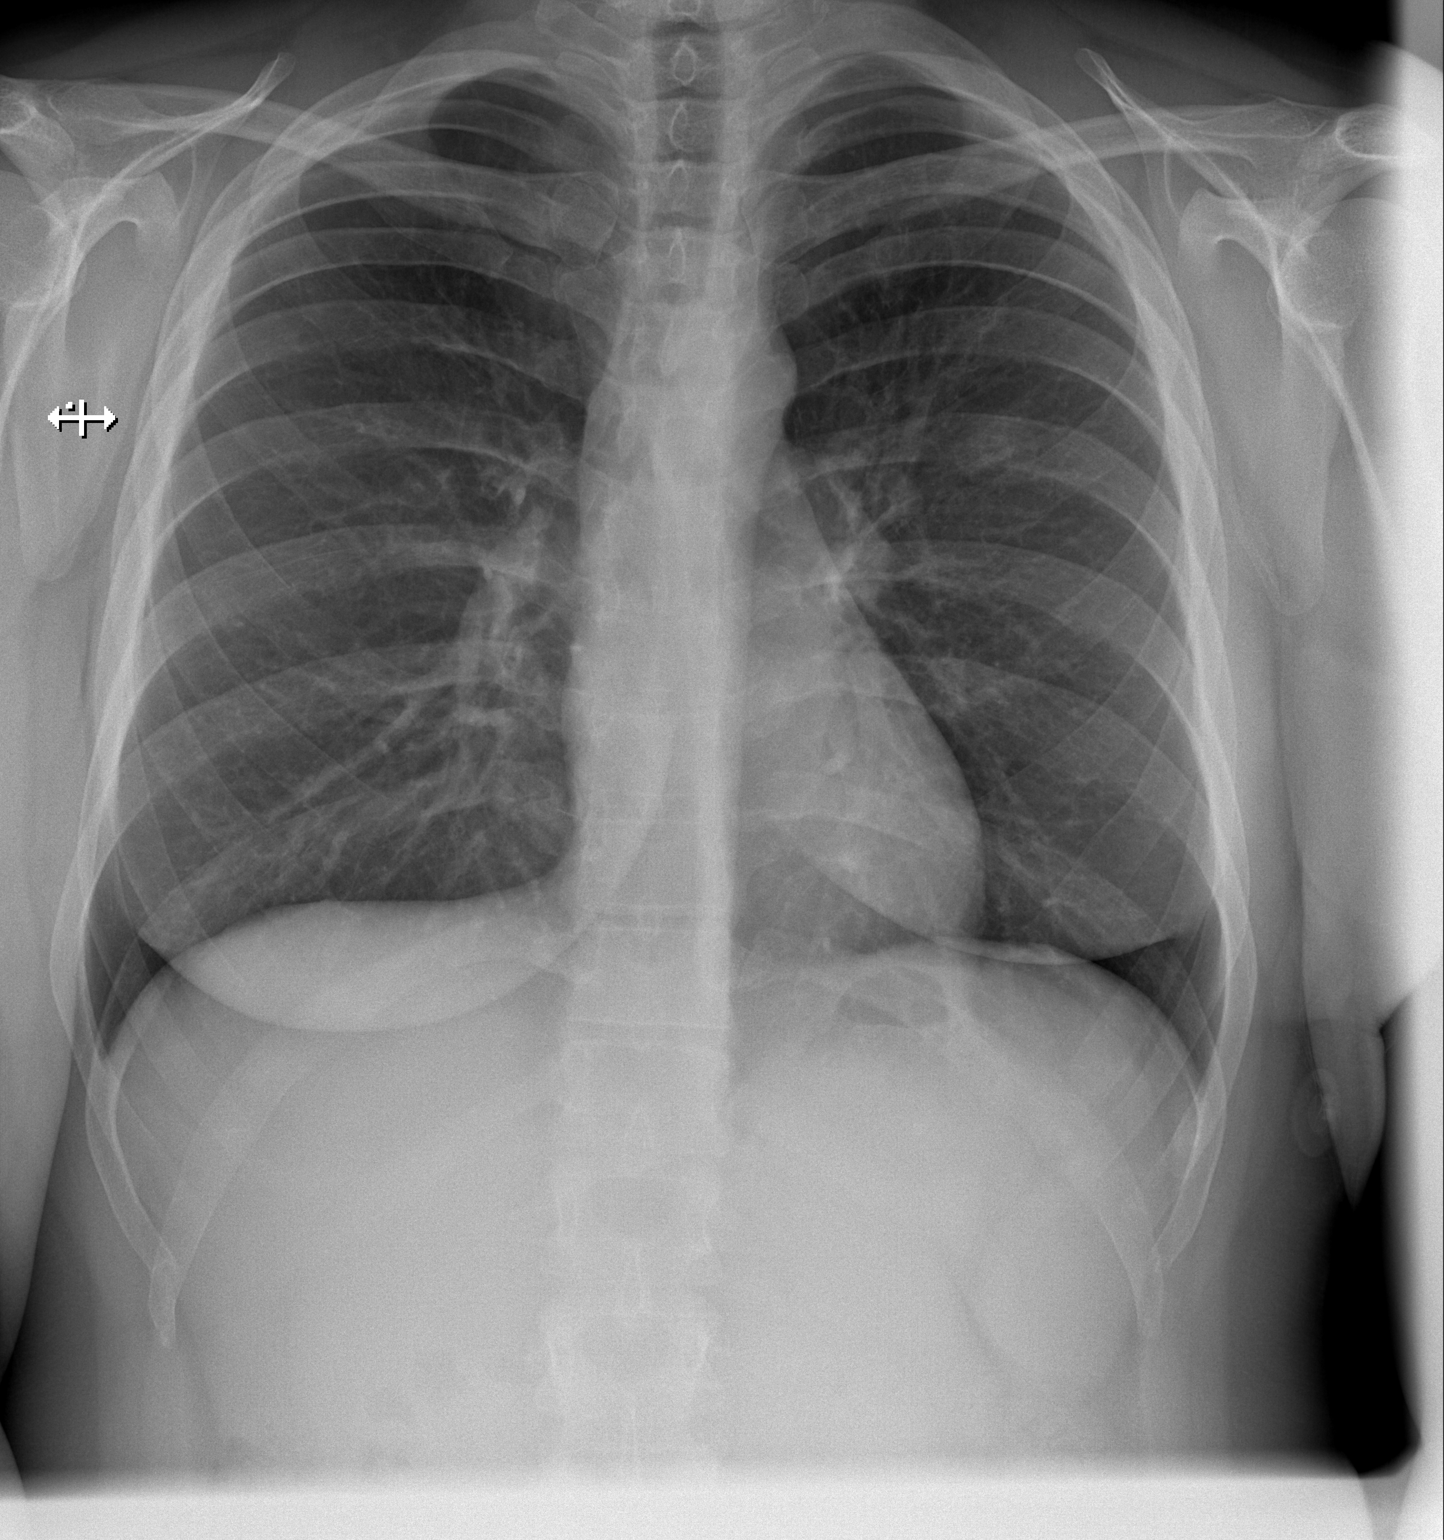

[w chest lat]
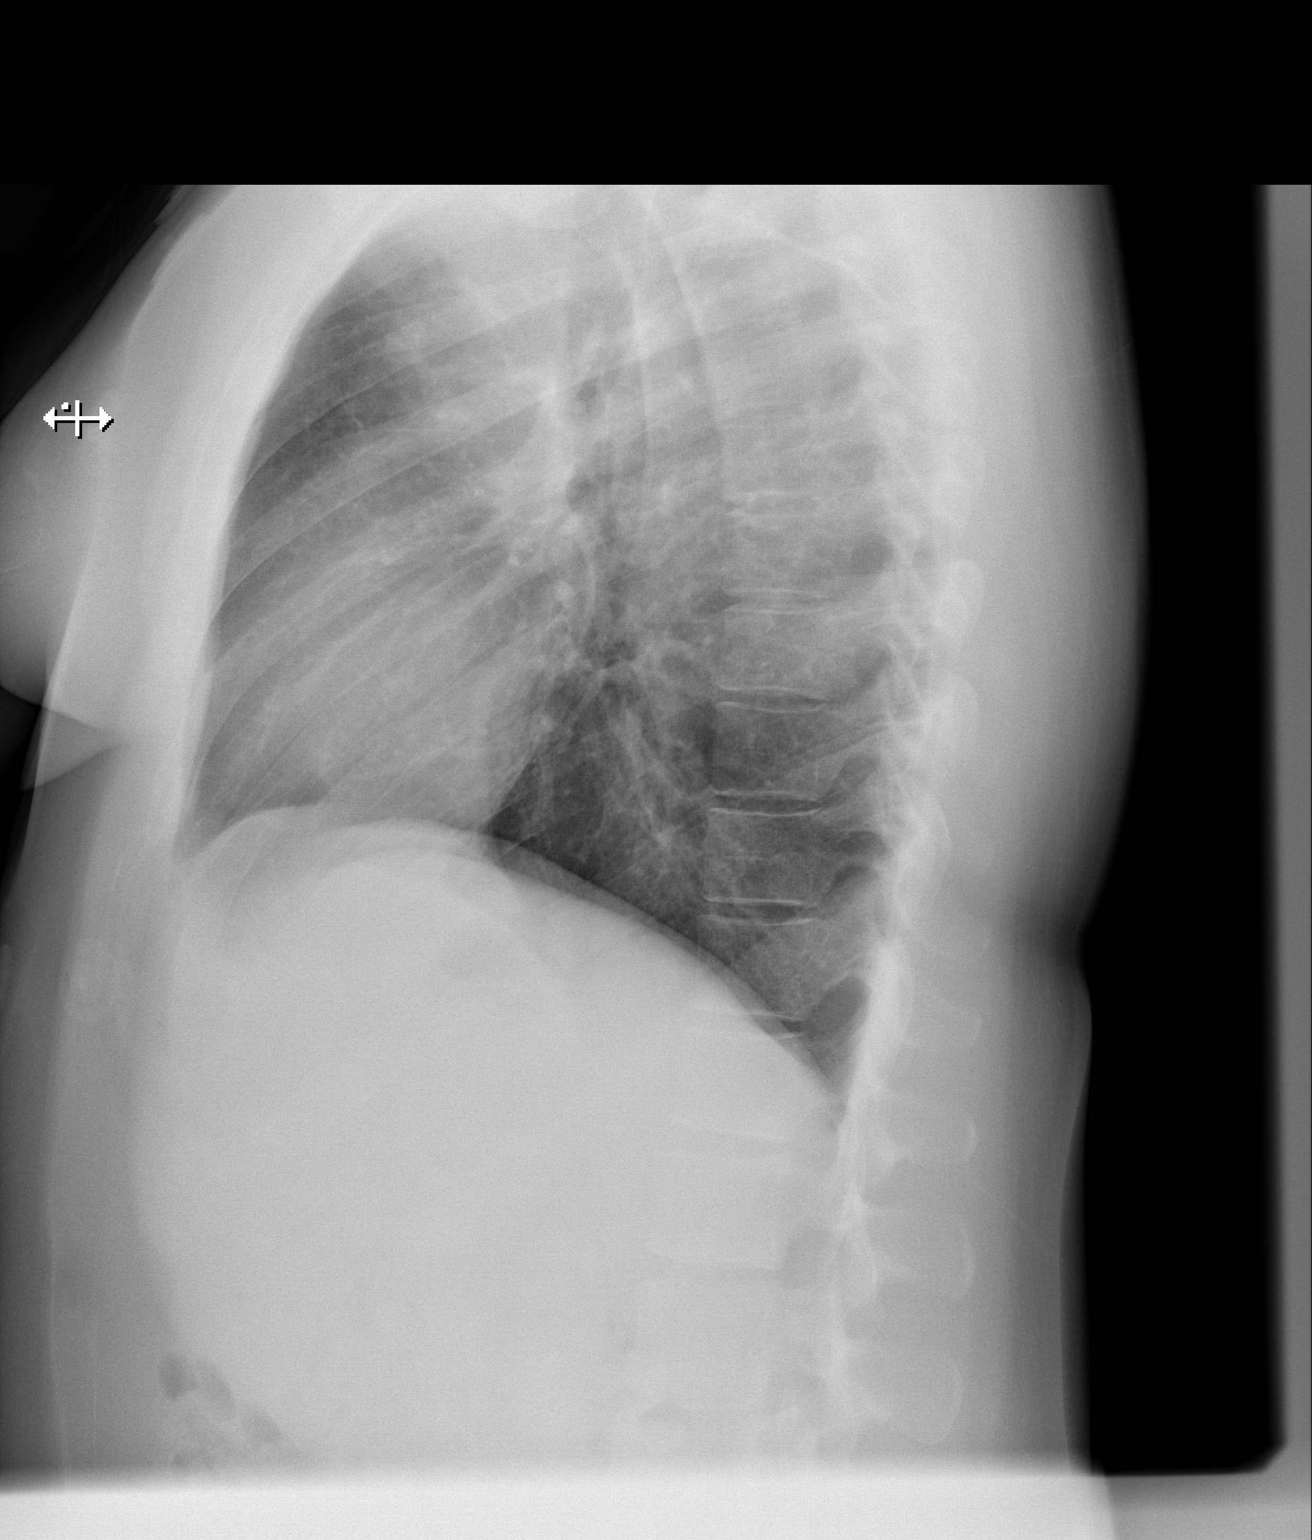

[2 of 2 positions shown; findings below may reference images not displayed]

FINDINGS: Normal heart size, mediastinal contours, and pulmonary vascularity.

Lungs clear.

No pleural effusion or pneumothorax.

Bones unremarkable.
IMPRESSION: Normal exam.

## 2019-12-08 ENCOUNTER — Inpatient Hospital Stay (HOSPITAL_COMMUNITY)
Admission: AD | Admit: 2019-12-08 | Discharge: 2019-12-08 | Disposition: A | Discharge status: Home / Self Care | Payer: Medicaid Other | Attending: Obstetrics & Gynecology | Admitting: Obstetrics & Gynecology

## 2019-12-08 ENCOUNTER — Inpatient Hospital Stay (HOSPITAL_COMMUNITY): Payer: Medicaid Other

## 2019-12-08 ENCOUNTER — Encounter (HOSPITAL_COMMUNITY): Payer: Self-pay | Admitting: *Deleted

## 2019-12-08 DIAGNOSIS — Z3A01 Less than 8 weeks gestation of pregnancy: Secondary | ICD-10-CM

## 2019-12-08 DIAGNOSIS — D259 Leiomyoma of uterus, unspecified: Secondary | ICD-10-CM | POA: Insufficient documentation

## 2019-12-08 DIAGNOSIS — Z8249 Family history of ischemic heart disease and other diseases of the circulatory system: Secondary | ICD-10-CM | POA: Insufficient documentation

## 2019-12-08 DIAGNOSIS — O3411 Maternal care for benign tumor of corpus uteri, first trimester: Secondary | ICD-10-CM | POA: Insufficient documentation

## 2019-12-08 DIAGNOSIS — O4690 Antepartum hemorrhage, unspecified, unspecified trimester: Secondary | ICD-10-CM

## 2019-12-08 DIAGNOSIS — O23591 Infection of other part of genital tract in pregnancy, first trimester: Secondary | ICD-10-CM | POA: Insufficient documentation

## 2019-12-08 DIAGNOSIS — Z888 Allergy status to other drugs, medicaments and biological substances status: Secondary | ICD-10-CM | POA: Insufficient documentation

## 2019-12-08 DIAGNOSIS — O10911 Unspecified pre-existing hypertension complicating pregnancy, first trimester: Secondary | ICD-10-CM | POA: Insufficient documentation

## 2019-12-08 DIAGNOSIS — Z79899 Other long term (current) drug therapy: Secondary | ICD-10-CM | POA: Insufficient documentation

## 2019-12-08 DIAGNOSIS — N760 Acute vaginitis: Secondary | ICD-10-CM

## 2019-12-08 DIAGNOSIS — O209 Hemorrhage in early pregnancy, unspecified: Secondary | ICD-10-CM

## 2019-12-08 LAB — URINALYSIS, ROUTINE W REFLEX MICROSCOPIC
Bilirubin Urine: NEGATIVE
Glucose, UA: NEGATIVE mg/dL
Ketones, ur: NEGATIVE mg/dL
Nitrite: NEGATIVE
Protein, ur: 30 mg/dL — AB
RBC / HPF: >50 RBC/hpf — ABNORMAL HIGH (ref 0–5)
Specific Gravity, Urine: 1.028 (ref 1.005–1.030)
pH: 5 (ref 5.0–8.0)

## 2019-12-08 LAB — WET PREP, GENITAL
Sperm: NONE SEEN
Trich, Wet Prep: NONE SEEN
Yeast Wet Prep HPF POC: NONE SEEN

## 2019-12-08 LAB — CBC
HCT: 40.2 % (ref 36.0–46.0)
Hemoglobin: 13.6 g/dL (ref 12.0–15.0)
MCH: 29.2 pg (ref 26.0–34.0)
MCHC: 33.8 g/dL (ref 30.0–36.0)
MCV: 86.5 fL (ref 80.0–100.0)
Platelets: 171 10*3/uL (ref 150–400)
RBC: 4.65 MIL/uL (ref 3.87–5.11)
RDW: 13.1 % (ref 11.5–15.5)
WBC: 10.7 10*3/uL — ABNORMAL HIGH (ref 4.0–10.5)
nRBC: 0 % (ref 0.0–0.2)

## 2019-12-08 LAB — HCG, QUANTITATIVE, PREGNANCY: hCG, Beta Chain, Quant, S: 16513 m[IU]/mL — ABNORMAL HIGH (ref <5)

## 2019-12-08 MED ORDER — METRONIDAZOLE 500 MG PO TABS: 500.0000 mg | ORAL_TABLET | Freq: Two times a day (BID) | ORAL | 0 refills | Status: DC

## 2019-12-08 NOTE — Discharge Instructions (Signed)
Prostaglandin-Induced Abortion  Prostaglandin-induced abortion is a procedure that uses prostaglandins to end a pregnancy. Prostaglandins are hormone-like medicines used to start labor by making the uterus tighten Radiographer, therapeutic). Prostaglandin-induced abortion can be done during the first trimester of pregnancy. Tell a health care provider about:  Any allergies you have.  All medicines you are taking, including vitamins, herbs, eye drops, creams, and over-the-counter medicines.  Any problems you or family members have had with anesthetic medicines.  Any blood disorders you have.  Any surgeries you have had.  Any medical conditions you have. What are the risks? Generally, this is a safe procedure. However, problems may occur, including:  Parts of the placenta and other afterbirth remaining in your uterus. If this happens, you may need to have a procedure where the inside of your uterus is scraped to remove it (dilation and curettage, or D&C).  Heavy bleeding.  Painful cramping that is not relieved by over-the-counter medicines.  Infection.  Failure to end the pregnancy. If this happens, you may need to have a dilation and curettage. What happens before the procedure?  You may have an exam of your outer and inner genitals and reproductive organs (pelvic exam).  You may have a blood test to confirm that you are pregnant.  You may have an ultrasound of your uterus.  After discussing the procedure with your health care provider, you will be asked to sign a consent form.  Ask your health care provider about changing or stopping your regular medicines. This is especially important if you are taking diabetes medicines or blood thinners. What happens during the procedure?  You will take prostaglandin in tablet form by mouth. After you take the medicine, you will be able to go home.  The medicine will induce bleeding that may last for a few hours or up to a few days, similar to a heavy  menstrual period.  Within 1-3 days of taking this medicine, you will be instructed to take more medicine by mouth or by putting it directly into your vagina (suppository). Follow your health care provider's instructions carefully. The procedure may vary among health care providers and hospitals. What happens after the procedure?  You may have cramps for a few days. They may feel like menstrual cramps.  You will continue to have bleeding that may last up to a few days, similar to a heavy menstrual period.  After about 2 weeks, or when you have stopped bleeding, you may have an ultrasound to confirm that you are no longer pregnant. Summary  Prostaglandin-induced abortion is a procedure that uses prostaglandins to end a pregnancy.  This is a safe procedure. However, problems may occur including failure to remove all the placenta and afterbirth and failure to end the pregnancy. Other problems include cramping, bleeding, and infection.  You will take a prostaglandin in tablet form, by mouth. After you take the medicine, you will be able to go home.  After about 2 weeks, or when you have stopped bleeding, you may have an ultrasound to confirm that you are no longer pregnant. This information is not intended to replace advice given to you by your health care provider. Make sure you discuss any questions you have with your health care provider. Document Revised: 03/07/2017 Document Reviewed: 06/12/2016 Elsevier Patient Education  [place].

## 2019-12-08 NOTE — MAU Note (Addendum)
Pt was seen at [facility] and sent to MAU. Had recent positive upt. Started spotting Monday and today more like light period. Did have some small clots today and mild cramping Monday. No pain now.Marland Kitchen Hx HTN and on medication

## 2019-12-08 NOTE — MAU Provider Note (Signed)
History     CSN: [id number]  Arrival date and time: 12/08/19 2595   First Provider Initiated Contact with Patient 12/08/19 2008      Chief Complaint  Patient presents with  . Threatened Miscarriage   KALA AMBRIZ is a 34 y.o. [id number] at [redacted]w[redacted]d by Definite LMP of October 17, 2019 who has received care at [facility] for previous pregnancies.  She presents today for Threatened Miscarriage.  Patient states she started having light spotting on Monday, but is now having bleeding "like a regular period."  She states she is not having any cramping. Patient denies sexual activity in the past 3 days.  She does report vaginal discharge that was non-odorous and did not cause irritation prior to bleeding onset.  Patient takes chlorthalidone 25mg  daily and took her dosage this morning.    OB History    Gravida  4   Para  2   Term  1   Preterm  1   AB  1   Living  2     SAB  0   TAB  1   Ectopic  0   Multiple  0   Live Births  2           Past Medical History:  Diagnosis Date  . Hypertension     Past Surgical History:  Procedure Laterality Date  . NO PAST SURGERIES      Family History  Problem Relation Age of Onset  . Hypertension Mother   . Hypertension Father   . Hyperlipidemia Father   . Heart disease Maternal Uncle   . Diabetes Maternal Grandmother   . Hypertension Maternal Grandmother   . Asthma Maternal Grandmother   . Depression Maternal Grandmother   . Birth defects Maternal Grandmother   . Cancer Maternal Grandfather 3       PANCREATIC  . Birth defects Paternal Uncle   . Cancer Paternal Uncle     Social History   Tobacco Use  . Smoking status: Never Smoker  . Smokeless tobacco: Never Used  Vaping Use  . Vaping Use: Never used  Substance Use Topics  . Alcohol use: No  . Drug use: No    Allergies:  Allergies  Allergen Reactions  . Lisinopril Anaphylaxis    Any beta blockers same reaction    Medications Prior to Admission  Medication  Sig Dispense Refill Last Dose  . chlorthalidone (HYGROTON) 25 MG tablet Take 25 mg by mouth daily.  0 12/08/2019 at Unknown time  . albuterol (PROVENTIL HFA;VENTOLIN HFA) 108 (90 Base) MCG/ACT inhaler Inhale 1-2 puffs into the lungs every 6 (six) hours as needed for wheezing or shortness of breath. 1 Inhaler 0 More than a month at Unknown time  . hydrochlorothiazide (MICROZIDE) 12.5 MG capsule Take 1 capsule (12.5 mg total) by mouth daily. 90 capsule 1 More than a month at Unknown time  . Norethindrone Acetate-Ethinyl Estrad-FE (LOESTRIN 24 FE) 1-20 MG-MCG(24) tablet Take 1 tablet by mouth daily. (Patient not taking: Reported on 07/10/2016) 1 Package 11   . predniSONE (STERAPRED UNI-PAK 21 TAB) 10 MG (21) TBPK tablet Take by mouth daily. Take 6 tabs by mouth daily  for 2 days, then 5 tabs for 2 days, then 4 tabs for 2 days, then 3 tabs for 2 days, 2 tabs for 2 days, then 1 tab by mouth daily for 2 days 42 tablet 0     Review of Systems  Constitutional: Negative for chills and fever.  Respiratory:  Negative for cough and shortness of breath.   Gastrointestinal: Negative for abdominal pain, constipation, diarrhea, nausea and vomiting.  Genitourinary: Positive for vaginal bleeding and vaginal discharge (Whitish gray d/c prior to bleeding. No odor, no itching or burning. ). Negative for difficulty urinating, dysuria and pelvic pain.  Musculoskeletal: Negative for back pain.  Neurological: Positive for headaches (1/10). Negative for dizziness and light-headedness.   Physical Exam   Blood pressure (!) 150/99, pulse 92, temperature 98.6 F (37 C), resp. rate 18, height 5\' 1"  (1.549 m), weight 75.8 kg, last menstrual period 10/17/2019, SpO2 100 %.  Physical Exam Vitals and nursing note reviewed. Exam conducted with a chaperone present.  Constitutional:      Appearance: Normal appearance.  HENT:     Head: Normocephalic and atraumatic.  Eyes:     Conjunctiva/sclera: Conjunctivae normal.   Cardiovascular:     Rate and Rhythm: Normal rate and regular rhythm.  Pulmonary:     Effort: Pulmonary effort is normal. No respiratory distress.     Breath sounds: Normal breath sounds.  Abdominal:     General: Abdomen is flat. Bowel sounds are normal.     Palpations: Abdomen is soft.     Tenderness: There is no abdominal tenderness.  Genitourinary:    Vagina: Vaginal discharge and bleeding present. No tenderness.     Cervix: No friability, erythema or cervical bleeding.     Comments: Speculum Exam: -Normal External Genitalia: Non tender, small amt bloody discharge at introitus -Vaginal Vault: Pink mucosa with good rugae. Small amt bright red blood. -wet prep collected -Cervix:Pink, no lesions, cysts, or polyps.  Appears closed. No active bleeding from os, but some bloody mucoid discharge noted-GC/CT collected -Bimanual Exam:  Some questionable enlargement, but difficult to assess d/t body panus.  Patient with some CMT vs discomfort.   Musculoskeletal:        General: Normal range of motion.     Cervical back: Normal range of motion.  Skin:    General: Skin is warm and dry.  Neurological:     Mental Status: She is alert and oriented to person, place, and time.  Psychiatric:        Mood and Affect: Mood normal.        Behavior: Behavior normal.        Thought Content: Thought content normal.     MAU Course  Procedures Results for orders placed or performed during the hospital encounter of 12/08/19 (from the past 24 hour(s))  Urinalysis, Routine w reflex microscopic Urine, Clean Catch     Status: Abnormal   Collection Time: 12/08/19  7:42 PM  Result Value Ref Range   Color, Urine YELLOW YELLOW   APPearance HAZY (A) CLEAR   Specific Gravity, Urine 1.028 1.005 - 1.030   pH 5.0 5.0 - 8.0   Glucose, UA NEGATIVE NEGATIVE mg/dL   Hgb urine dipstick LARGE (A) NEGATIVE   Bilirubin Urine NEGATIVE NEGATIVE   Ketones, ur NEGATIVE NEGATIVE mg/dL   Protein, ur 30 (A) NEGATIVE mg/dL    Nitrite NEGATIVE NEGATIVE   Leukocytes,Ua TRACE (A) NEGATIVE   RBC / HPF >50 (H) 0 - 5 RBC/hpf   WBC, UA 6-10 0 - 5 WBC/hpf   Bacteria, UA FEW (A) NONE SEEN   Squamous Epithelial / LPF 6-10 0 - 5   Mucus PRESENT   Wet prep, genital     Status: Abnormal   Collection Time: 12/08/19  8:22 PM   Specimen: Vaginal  Result Value Ref  Range   Yeast Wet Prep HPF POC NONE SEEN NONE SEEN   Trich, Wet Prep NONE SEEN NONE SEEN   Clue Cells Wet Prep HPF POC PRESENT (A) NONE SEEN   WBC, Wet Prep HPF POC MODERATE (A) NONE SEEN   Sperm NONE SEEN   CBC     Status: Abnormal   Collection Time: 12/08/19  8:36 PM  Result Value Ref Range   WBC 10.7 (H) 4.0 - 10.5 K/uL   RBC 4.65 3.87 - 5.11 MIL/uL   Hemoglobin 13.6 12.0 - 15.0 g/dL   HCT 40.2 36 - 46 %   MCV 86.5 80.0 - 100.0 fL   MCH 29.2 26.0 - 34.0 pg   MCHC 33.8 30.0 - 36.0 g/dL   RDW 13.1 11.5 - 15.5 %   Platelets 171 150 - 400 K/uL   nRBC 0.0 0.0 - 0.2 %   US OB Comp Less 14 Wks  Result Date: 12/08/2019 CLINICAL DATA:  Bleeding for 2 days EXAM: OBSTETRIC <14 WK [place] AND TRANSVAGINAL OB US TECHNIQUE: Both transabdominal and transvaginal ultrasound examinations were performed for complete evaluation of the gestation as well as the maternal uterus, adnexal regions, and pelvic cul-de-sac. Transvaginal technique was performed to assess early pregnancy. COMPARISON:  None. FINDINGS: Intrauterine gestational sac: Single Yolk sac:  Visualized. Embryo:  Visualized. Cardiac Activity: Visualized. Heart Rate: 141  bpm CRL:  8.3 mm   6 w   5 d                  [place] EDC: 07/28/2020 Subchorionic hemorrhage:  None visualized. Maternal uterus/adnexae: Within the right ovary there is a corpus luteum noted. Numerous heterogeneous intrauterine fibroids are seen. The largest in the posterior fundus measuring 8.2 x 7.8 x 8.7 cm IMPRESSION: Single live intrauterine pregnancy measuring 6 weeks 5 days Multiple intrauterine fibroids Electronically Signed   By: Prudencio Pair M.D.    On: 12/08/2019 21:03    MDM Pelvic Exam; Wet Prep and GC/CT Labs: UA, UPT, CBC, hCG Ultrasound Assessment and Plan  34 year old,  [id number] at 7.3weeks Vaginal Bleeding   -Reviewed POC with patient. -Exam performed and findings discussed.  -Cultures collected and pending.  -Labs ordered -Offered and declines pain medication. -Will send to [place] and await results.    Maryann Conners 12/08/2019, 8:08 PM   Reassessment (9:22 PM) SIUP at 6.5 weeks Bacterial Vaginosis  -Results as above -Provider to bedside to discuss. -Congratulations and [place] pictures given. -Patient instructed to start [facility] and PNV. -Patient reports that they plan for termination this weekend. -Apologies given and provider offers to take back pictures.  Patient declines. -Patient questions if bleeding will result inability to receive abortion. -Patient informed that vaginal bleeding should not interfere with termination procedure be it pharmacological or surgical. -patient without further questions or concerns. -Encouraged to call or return to MAU if symptoms worsen or with the onset of new symptoms. -Discharged to home in stable condition.  Maryann Conners MSN, CNM Advanced Practice Provider, [facility]

## 2019-12-09 LAB — URINE CULTURE

## 2019-12-10 LAB — GC/CHLAMYDIA PROBE AMP (~~LOC~~) NOT AT ARMC
Chlamydia: NEGATIVE
Comment: NEGATIVE
Comment: NORMAL
Neisseria Gonorrhea: NEGATIVE

## 2019-12-11 ENCOUNTER — Inpatient Hospital Stay (HOSPITAL_COMMUNITY): Payer: Medicaid Other

## 2019-12-11 ENCOUNTER — Inpatient Hospital Stay (HOSPITAL_COMMUNITY)
Admission: AD | Admit: 2019-12-11 | Discharge: 2019-12-11 | Disposition: A | Discharge status: Home / Self Care | Payer: Medicaid Other | Attending: Obstetrics and Gynecology | Admitting: Obstetrics and Gynecology

## 2019-12-11 ENCOUNTER — Other Ambulatory Visit: Payer: Self-pay

## 2019-12-11 ENCOUNTER — Encounter (HOSPITAL_COMMUNITY): Payer: Self-pay | Admitting: Obstetrics and Gynecology

## 2019-12-11 DIAGNOSIS — O209 Hemorrhage in early pregnancy, unspecified: Secondary | ICD-10-CM

## 2019-12-11 DIAGNOSIS — O10011 Pre-existing essential hypertension complicating pregnancy, first trimester: Secondary | ICD-10-CM | POA: Insufficient documentation

## 2019-12-11 DIAGNOSIS — O039 Complete or unspecified spontaneous abortion without complication: Secondary | ICD-10-CM | POA: Insufficient documentation

## 2019-12-11 DIAGNOSIS — R109 Unspecified abdominal pain: Secondary | ICD-10-CM | POA: Insufficient documentation

## 2019-12-11 DIAGNOSIS — Z3A01 Less than 8 weeks gestation of pregnancy: Secondary | ICD-10-CM | POA: Insufficient documentation

## 2019-12-11 DIAGNOSIS — O10919 Unspecified pre-existing hypertension complicating pregnancy, unspecified trimester: Secondary | ICD-10-CM

## 2019-12-11 DIAGNOSIS — Z79899 Other long term (current) drug therapy: Secondary | ICD-10-CM | POA: Insufficient documentation

## 2019-12-11 DIAGNOSIS — O26891 Other specified pregnancy related conditions, first trimester: Secondary | ICD-10-CM | POA: Insufficient documentation

## 2019-12-11 DIAGNOSIS — M545 Low back pain: Secondary | ICD-10-CM | POA: Insufficient documentation

## 2019-12-11 LAB — CBC WITH DIFFERENTIAL/PLATELET
Abs Immature Granulocytes: 0.03 10*3/uL (ref 0.00–0.07)
Basophils Absolute: 0 10*3/uL (ref 0.0–0.1)
Basophils Relative: 0 %
Eosinophils Absolute: 0.4 10*3/uL (ref 0.0–0.5)
Eosinophils Relative: 4 %
HCT: 38.8 % (ref 36.0–46.0)
Hemoglobin: 13 g/dL (ref 12.0–15.0)
Immature Granulocytes: 0 %
Lymphocytes Relative: 21 %
Lymphs Abs: 2.1 10*3/uL (ref 0.7–4.0)
MCH: 28.8 pg (ref 26.0–34.0)
MCHC: 33.5 g/dL (ref 30.0–36.0)
MCV: 85.8 fL (ref 80.0–100.0)
Monocytes Absolute: 0.7 10*3/uL (ref 0.1–1.0)
Monocytes Relative: 7 %
Neutro Abs: 6.5 10*3/uL (ref 1.7–7.7)
Neutrophils Relative %: 68 %
Platelets: 180 10*3/uL (ref 150–400)
RBC: 4.52 MIL/uL (ref 3.87–5.11)
RDW: 13 % (ref 11.5–15.5)
WBC: 9.7 10*3/uL (ref 4.0–10.5)
nRBC: 0 % (ref 0.0–0.2)

## 2019-12-11 LAB — URINALYSIS, ROUTINE W REFLEX MICROSCOPIC
Bacteria, UA: NONE SEEN
Bilirubin Urine: NEGATIVE
Glucose, UA: NEGATIVE mg/dL
Ketones, ur: NEGATIVE mg/dL
Leukocytes,Ua: NEGATIVE
Nitrite: NEGATIVE
Protein, ur: NEGATIVE mg/dL
Specific Gravity, Urine: 1.013 (ref 1.005–1.030)
pH: 6 (ref 5.0–8.0)

## 2019-12-11 LAB — COMPREHENSIVE METABOLIC PANEL
ALT: 19 U/L (ref 0–44)
AST: 22 U/L (ref 15–41)
Albumin: 3.8 g/dL (ref 3.5–5.0)
Alkaline Phosphatase: 69 U/L (ref 38–126)
Anion gap: 10 (ref 5–15)
BUN: 11 mg/dL (ref 6–20)
CO2: 28 mmol/L (ref 22–32)
Calcium: 9.3 mg/dL (ref 8.9–10.3)
Chloride: 100 mmol/L (ref 98–111)
Creatinine, Ser: 0.86 mg/dL (ref 0.44–1.00)
GFR calc Af Amer: >60 mL/min (ref >60)
GFR calc non Af Amer: >60 mL/min (ref >60)
Glucose, Bld: 101 mg/dL — ABNORMAL HIGH (ref 70–99)
Potassium: 3 mmol/L — ABNORMAL LOW (ref 3.5–5.1)
Sodium: 138 mmol/L (ref 135–145)
Total Bilirubin: 0.7 mg/dL (ref 0.3–1.2)
Total Protein: 6.4 g/dL — ABNORMAL LOW (ref 6.5–8.1)

## 2019-12-11 LAB — HCG, QUANTITATIVE, PREGNANCY: hCG, Beta Chain, Quant, S: 1894 m[IU]/mL — ABNORMAL HIGH (ref <5)

## 2019-12-11 LAB — ABO/RH: ABO/RH(D): O POS

## 2019-12-11 NOTE — Discharge Instructions (Signed)
Managing Pregnancy Loss °Pregnancy loss can happen any time during a pregnancy. Often the cause is not known. It is rarely because of anything you did. Pregnancy loss in early pregnancy (during the first trimester) is called a miscarriage. This type of pregnancy loss is the most common. Pregnancy loss that happens after 20 weeks of pregnancy is called fetal demise if the baby's heart stops beating before birth. Fetal demise is much less common. Some women experience spontaneous labor shortly after fetal demise resulting in a stillborn birth (stillbirth). °Any pregnancy loss can be devastating. You will need to recover both physically and emotionally. Most women are able to get pregnant again after a pregnancy loss and deliver a healthy baby. °How to manage emotional recovery ° °Pregnancy loss is very hard emotionally. You may feel many different emotions while you grieve. You may feel sad and angry. You may also feel guilty. It is normal to have periods of crying. Emotional recovery can take longer than physical recovery. It is different for everyone. °Taking these steps can help you in managing this loss: °· Remember that it is unlikely you did anything to cause the pregnancy loss. °· Share your thoughts and feelings with friends, family, and your partner. Remember that your partner is also recovering emotionally. °· Make sure you have a good support system. Do not spend too much time alone. °· Meet with a pregnancy loss counselor or join a pregnancy loss support group. °· Get enough sleep and eat a healthy diet. Return to regular exercise when you have recovered physically. °· Do not use drugs or alcohol to manage your emotions. °· Consider seeing a mental health professional to help you recover emotionally. °· Ask a friend or loved one to help you decide what to do with any clothing and nursery items you received for your baby. °In the case of a stillbirth, many women benefit from taking additional steps in the  grieving process. You may want to: °· Hold your baby after the birth. °· Name your baby. °· Request a birth certificate. °· Create a keepsake such as handprints or footprints. °· Dress your baby and have a picture taken. °· Make funeral arrangements. °· Ask for a baptism or blessing. °Hospitals have staff members who can help you with all these arrangements. °How to recognize emotional stress °It is normal to have emotional stress after a pregnancy loss. But emotional stress that lasts a long time or becomes severe requires treatment. Watch out for these signs of severe emotional stress: °· Sadness, anger, or guilt that is not going away and is interfering with your normal activities. °· Relationship problems that have occurred or gotten worse since the pregnancy loss. °· Signs of depression that last longer than 2 weeks. These may include: °? Sadness. °? Anxiety. °? Hopelessness. °? Loss of interest in activities you enjoy. °? Inability to concentrate. °? Trouble sleeping or sleeping too much. °? Loss of appetite or overeating. °? Thoughts of death or of hurting yourself. °Follow these instructions at home: °· Take over-the-counter and prescription medicines only as told by your health care provider. °· Rest at home until your energy level returns. Return to your normal activities as told by your health care provider. Ask your health care provider what activities are safe for you. °· When you are ready, meet with your health care provider to discuss steps to take for a future pregnancy. °· Keep all follow-up visits as told by your health care provider. This is important. °  Where to find support  To help you and your partner with the process of grieving, talk with your health care provider or seek counseling.  Consider meeting with others who have experienced pregnancy loss. Ask your health care provider about support groups and resources. Where to find more information  [place] Department of Health and Human  Services Office on Women's Health: www.womenshealth.gov  American Pregnancy Association: www.americanpregnancy.org Contact a health care provider if:  You continue to experience grief, sadness, or lack of motivation for everyday activities, and those feelings do not improve over time.  You are struggling to recover emotionally, especially if you are using alcohol or substances to help. Get help right away if:  You have thoughts of hurting yourself or others. If you ever feel like you may hurt yourself or others, or have thoughts about taking your own life, get help right away. You can go to your nearest emergency department or call:  Your local emergency services (911 in the [place]  A suicide crisis helpline, such as the National Suicide Prevention Lifeline at 1-800-273-8255. This is open 24 hours a day. Summary  Any pregnancy loss can be difficult physically and emotionally.  You may experience many different emotions while you grieve. Emotional recovery can last longer than physical recovery.  It is normal to have emotional stress after a pregnancy loss. But emotional stress that lasts a long time or becomes severe requires treatment.  See your health care provider if you are struggling emotionally after a pregnancy loss. This information is not intended to replace advice given to you by your health care provider. Make sure you discuss any questions you have with your health care provider. Document Revised: 07/15/2018 Document Reviewed: 06/05/2017 Elsevier Patient Education  2020 [employer] Inc.  

## 2019-12-11 NOTE — MAU Note (Signed)
Jasmine Baird is a 34 y.o. at [redacted]w[redacted]d here in MAU reporting: after leaving here on Wednesday bleeding continued and has been heavier since Thursday. States has been passing clots and tissues. Clots are about the size of a silver dollar or smaller. Having some mild cramping and dull lower back pain.   Onset of complaint: ongoing but worse  Pain score: 2/10  Vitals:   12/11/19 1209  BP: (!) 148/95  Pulse: 96  Resp: 17  Temp: 99.3 F (37.4 C)  SpO2: 98%     Lab orders placed from triage: UA

## 2019-12-11 NOTE — MAU Provider Note (Signed)
History     CSN: [id number]  Arrival date and time: 12/11/19 1130   First Provider Initiated Contact with Patient 12/11/19 1428      Chief Complaint  Patient presents with  . Abdominal Pain  . Back Pain  . Vaginal Bleeding   HPI   Ms.Jasmine Baird is a 34 y.o. female [phone] @[redacted]w[redacted]d  here with heavy vaginal bleeding. She was seen here in MAU on 9/1 with bleeding. Reports the bleeding became very heavy with clots this morning. She reports cramping at that time which is now improved. Reports she passed clots and tissue this morning.  OB History    Gravida  4   Para  2   Term  1   Preterm  1   AB  1   Living  2     SAB  0   TAB  1   Ectopic  0   Multiple  0   Live Births  2           Past Medical History:  Diagnosis Date  . Hypertension     Past Surgical History:  Procedure Laterality Date  . NO PAST SURGERIES      Family History  Problem Relation Age of Onset  . Hypertension Mother   . Hypertension Father   . Hyperlipidemia Father   . Heart disease Maternal Uncle   . Diabetes Maternal Grandmother   . Hypertension Maternal Grandmother   . Asthma Maternal Grandmother   . Depression Maternal Grandmother   . Birth defects Maternal Grandmother   . Cancer Maternal Grandfather 22       PANCREATIC  . Birth defects Paternal Uncle   . Cancer Paternal Uncle     Social History   Tobacco Use  . Smoking status: Never Smoker  . Smokeless tobacco: Never Used  Vaping Use  . Vaping Use: Never used  Substance Use Topics  . Alcohol use: No  . Drug use: No    Allergies:  Allergies  Allergen Reactions  . Lisinopril Anaphylaxis    Any beta blockers same reaction    Medications Prior to Admission  Medication Sig Dispense Refill Last Dose  . albuterol (PROVENTIL HFA;VENTOLIN HFA) 108 (90 Base) MCG/ACT inhaler Inhale 1-2 puffs into the lungs every 6 (six) hours as needed for wheezing or shortness of breath. 1 Inhaler 0   . chlorthalidone (HYGROTON)  25 MG tablet Take 25 mg by mouth daily.  0   . metroNIDAZOLE (FLAGYL) 500 MG tablet Take 1 tablet (500 mg total) by mouth 2 (two) times daily. 14 tablet 0    Results for orders placed or performed during the hospital encounter of 12/11/19 (from the past 48 hour(s))  Urinalysis, Routine w reflex microscopic Urine, Clean Catch     Status: Abnormal   Collection Time: 12/11/19 12:03 PM  Result Value Ref Range   Color, Urine YELLOW YELLOW   APPearance CLEAR CLEAR   Specific Gravity, Urine 1.013 1.005 - 1.030   pH 6.0 5.0 - 8.0   Glucose, UA NEGATIVE NEGATIVE mg/dL   Hgb urine dipstick MODERATE (A) NEGATIVE   Bilirubin Urine NEGATIVE NEGATIVE   Ketones, ur NEGATIVE NEGATIVE mg/dL   Protein, ur NEGATIVE NEGATIVE mg/dL   Nitrite NEGATIVE NEGATIVE   Leukocytes,Ua NEGATIVE NEGATIVE   RBC / HPF 21-50 0 - 5 RBC/hpf   Bacteria, UA NONE SEEN NONE SEEN   Squamous Epithelial / LPF 0-5 0 - 5   Mucus PRESENT  Comment: Performed at [facility], [place] [address], [place], [place] [address]  CBC with Differential/Platelet     Status: None   Collection Time: 12/11/19 12:51 PM  Result Value Ref Range   WBC 9.7 4.0 - 10.5 K/uL   RBC 4.52 3.87 - 5.11 MIL/uL   Hemoglobin 13.0 12.0 - 15.0 g/dL   HCT 38.8 36 - 46 %   MCV 85.8 80.0 - 100.0 fL   MCH 28.8 26.0 - 34.0 pg   MCHC 33.5 30.0 - 36.0 g/dL   RDW 13.0 11.5 - 15.5 %   Platelets 180 150 - 400 K/uL   nRBC 0.0 0.0 - 0.2 %   Neutrophils Relative % 68 %   Neutro Abs 6.5 1.7 - 7.7 K/uL   Lymphocytes Relative 21 %   Lymphs Abs 2.1 0.7 - 4.0 K/uL   Monocytes Relative 7 %   Monocytes Absolute 0.7 0 - 1 K/uL   Eosinophils Relative 4 %   Eosinophils Absolute 0.4 0 - 0 K/uL   Basophils Relative 0 %   Basophils Absolute 0.0 0 - 0 K/uL   Immature Granulocytes 0 %   Abs Immature Granulocytes 0.03 0.00 - 0.07 K/uL    Comment: Performed at [facility], [address], [place], [place] [address]  Comprehensive metabolic panel     Status:  Abnormal   Collection Time: 12/11/19 12:51 PM  Result Value Ref Range   Sodium 138 135 - 145 mmol/L   Potassium 3.0 (L) 3.5 - 5.1 mmol/L   Chloride 100 98 - 111 mmol/L   CO2 28 22 - 32 mmol/L   Glucose, Bld 101 (H) 70 - 99 mg/dL    Comment: Glucose reference range applies only to samples taken after fasting for at least 8 hours.   BUN 11 6 - 20 mg/dL   Creatinine, Ser 0.86 0.44 - 1.00 mg/dL   Calcium 9.3 8.9 - 10.3 mg/dL   Total Protein 6.4 (L) 6.5 - 8.1 g/dL   Albumin 3.8 3.5 - 5.0 g/dL   AST 22 15 - 41 U/L   ALT 19 0 - 44 U/L   Alkaline Phosphatase 69 38 - 126 U/L   Total Bilirubin 0.7 0.3 - 1.2 mg/dL   GFR calc non Af Amer >60 >60 mL/min   GFR calc Af Amer >60 >60 mL/min   Anion gap 10 5 - 15    Comment: Performed at [facility], [place] [address] [place], [place] [address]  ABO/Rh     Status: None   Collection Time: 12/11/19 12:51 PM  Result Value Ref Range   ABO/RH(D) O POS    No rh immune globuloin      NOT A RH IMMUNE GLOBULIN CANDIDATE, PT RH POSITIVE Performed at [place] [facility] [address] [place], [place] [address]   hCG, quantitative, pregnancy     Status: Abnormal   Collection Time: 12/11/19 12:51 PM  Result Value Ref Range   hCG, Beta Chain, Quant, S 1,894 (H) <5 mIU/mL    Comment:          GEST. AGE      CONC.  (mIU/mL)   <=1 WEEK        5 - 50     2 WEEKS       50 - 500     3 WEEKS       100 - 10,000     4 WEEKS  1,000 - 30,000     5 WEEKS     3,500 - 115,000   6-8 WEEKS     12,000 - 270,000    12 WEEKS     15,000 - 220,000        FEMALE AND NON-PREGNANT FEMALE:     LESS THAN 5 mIU/mL Performed at [facility], [place] [address] [place], [place] [address]    US OB LESS THAN 14 WEEKS WITH [place] TRANSVAGINAL  Result Date: 12/11/2019 CLINICAL DATA:  Vaginal bleeding. Pregnant patient. Patient 7 weeks and 3 days pregnant based on her last menstrual period. EXAM: OBSTETRIC <14 WK [place] AND TRANSVAGINAL OB US TECHNIQUE: Both transabdominal and  transvaginal ultrasound examinations were performed for complete evaluation of the gestation as well as the maternal uterus, adnexal regions, and pelvic cul-de-sac. Transvaginal technique was performed to assess early pregnancy. COMPARISON:  None. FINDINGS: Intrauterine gestational sac: None Yolk sac:  Not Visualized. Embryo:  Not Visualized. Cardiac Activity: Not applicable Heart Rate:   bpm Maternal uterus/adnexae: Uterus enlarged by 2 adjacent fibroids. There is a posterior fibroid bulging the serosal contour measuring 8 x 6.5 x 7.6 cm. An adjacent fibroid arises from the right fundus measuring 4.2 x 3.8 x 3.8 cm. Endometrium is thin.  No endometrial fluid. Ovaries are normal in size and echogenicity. No adnexal masses. No pelvic free fluid. IMPRESSION: 1. No sonographic evidence of an intrauterine or ectopic pregnancy. 2. Two large fibroids. 3. Normal ovaries and adnexa. Electronically Signed   By: Lajean Manes M.D.   On: 12/11/2019 13:44   Review of Systems  Constitutional: Negative for fever.  Gastrointestinal: Positive for abdominal pain (lower abdominal cramping).  Genitourinary: Positive for vaginal bleeding.   Physical Exam   Blood pressure (!) 148/95, pulse 96, temperature 99.3 F (37.4 C), temperature source Oral, resp. rate 17, height 5\' 1"  (1.549 m), weight 74.8 kg, last menstrual period 10/17/2019, SpO2 98 %.  Physical Exam Constitutional:      General: She is not in acute distress.    Appearance: She is well-developed.  HENT:     Head: Normocephalic.  Genitourinary:    Cervix: Dilated.     Uterus: Normal.      Comments: Closed, anterior. Small amount of dark red blood on exam glove.  Skin:    General: Skin is warm.  Neurological:     General: No focal deficit present.     Mental Status: She is alert.  Psychiatric:        Mood and Affect: Mood normal.    MAU Course  Procedures  None  MDM  O positive blood type  CBC, Hcg, ABO US OB transvaginal  US now shows no  fetus. Previous US on 9/1 showed fetus with heart rate of 141  Assessment and Plan   A:  1. Complete miscarriage   2. Vaginal bleeding in pregnancy, first trimester   3. Chronic hypertension in pregnancy      P:  Discharge home in stable condition Bleeding is stable F/u in the office in 1-2 weeks for a quant Return to MAU if symptoms worsen  Eriyana Sweeten, Artist Pais, NP 12/11/2019 3:20 PM

## 2019-12-16 ENCOUNTER — Telehealth: Payer: Self-pay | Admitting: Family Medicine

## 2019-12-16 NOTE — Telephone Encounter (Signed)
Attempted to contact patient to get her scheduled for non-stat labs and sab f/u. No answer, left voicemail for patient to give the office a call back to be scheduled.

## 2020-12-18 DIAGNOSIS — E876 Hypokalemia: Secondary | ICD-10-CM | POA: Diagnosis not present

## 2020-12-18 DIAGNOSIS — I10 Essential (primary) hypertension: Secondary | ICD-10-CM | POA: Diagnosis not present

## 2020-12-18 DIAGNOSIS — R030 Elevated blood-pressure reading, without diagnosis of hypertension: Secondary | ICD-10-CM | POA: Diagnosis not present

## 2020-12-18 DIAGNOSIS — Z6832 Body mass index (BMI) 32.0-32.9, adult: Secondary | ICD-10-CM | POA: Diagnosis not present

## 2020-12-18 DIAGNOSIS — J4520 Mild intermittent asthma, uncomplicated: Secondary | ICD-10-CM | POA: Diagnosis not present

## 2020-12-18 DIAGNOSIS — E559 Vitamin D deficiency, unspecified: Secondary | ICD-10-CM | POA: Diagnosis not present

## 2020-12-18 DIAGNOSIS — Z1159 Encounter for screening for other viral diseases: Secondary | ICD-10-CM | POA: Diagnosis not present

## 2021-05-31 DIAGNOSIS — Z713 Dietary counseling and surveillance: Secondary | ICD-10-CM | POA: Diagnosis not present

## 2021-08-14 DIAGNOSIS — Z713 Dietary counseling and surveillance: Secondary | ICD-10-CM | POA: Diagnosis not present

## 2021-08-17 ENCOUNTER — Encounter: Payer: Self-pay | Admitting: Family Medicine

## 2021-08-17 ENCOUNTER — Other Ambulatory Visit: Payer: Self-pay | Admitting: Family Medicine

## 2021-08-17 ENCOUNTER — Ambulatory Visit: Payer: BC Managed Care – PPO | Admitting: Family Medicine

## 2021-08-17 ENCOUNTER — Telehealth: Payer: Self-pay | Admitting: Family Medicine

## 2021-08-17 VITALS — BP 154/104 | HR 82 | Temp 97.0°F | Ht 61.0 in | Wt 173.6 lb

## 2021-08-17 DIAGNOSIS — Z8759 Personal history of other complications of pregnancy, childbirth and the puerperium: Secondary | ICD-10-CM | POA: Insufficient documentation

## 2021-08-17 DIAGNOSIS — Z6832 Body mass index (BMI) 32.0-32.9, adult: Secondary | ICD-10-CM

## 2021-08-17 DIAGNOSIS — I10 Essential (primary) hypertension: Secondary | ICD-10-CM

## 2021-08-17 DIAGNOSIS — Z1159 Encounter for screening for other viral diseases: Secondary | ICD-10-CM | POA: Diagnosis not present

## 2021-08-17 DIAGNOSIS — E669 Obesity, unspecified: Secondary | ICD-10-CM | POA: Insufficient documentation

## 2021-08-17 DIAGNOSIS — D219 Benign neoplasm of connective and other soft tissue, unspecified: Secondary | ICD-10-CM

## 2021-08-17 DIAGNOSIS — E876 Hypokalemia: Secondary | ICD-10-CM

## 2021-08-17 DIAGNOSIS — J302 Other seasonal allergic rhinitis: Secondary | ICD-10-CM | POA: Diagnosis not present

## 2021-08-17 DIAGNOSIS — J45909 Unspecified asthma, uncomplicated: Secondary | ICD-10-CM | POA: Insufficient documentation

## 2021-08-17 DIAGNOSIS — J4520 Mild intermittent asthma, uncomplicated: Secondary | ICD-10-CM

## 2021-08-17 HISTORY — DX: Personal history of other complications of pregnancy, childbirth and the puerperium: Z87.59

## 2021-08-17 LAB — LIPID PANEL
Cholesterol: 147 mg/dL (ref 0–200)
HDL: 48.2 mg/dL (ref >39.00)
LDL Cholesterol: 69 mg/dL (ref 0–99)
NonHDL: 98.52
Total CHOL/HDL Ratio: 3
Triglycerides: 147 mg/dL (ref 0.0–149.0)
VLDL: 29.4 mg/dL (ref 0.0–40.0)

## 2021-08-17 LAB — COMPREHENSIVE METABOLIC PANEL
ALT: 13 U/L (ref 0–35)
AST: 20 U/L (ref 0–37)
Albumin: 4.4 g/dL (ref 3.5–5.2)
Alkaline Phosphatase: 106 U/L (ref 39–117)
BUN: 10 mg/dL (ref 6–23)
CO2: 32 mEq/L (ref 19–32)
Calcium: 9.8 mg/dL (ref 8.4–10.5)
Chloride: 97 mEq/L (ref 96–112)
Creatinine, Ser: 0.83 mg/dL (ref 0.40–1.20)
GFR: 90.26 mL/min (ref >60.00)
Glucose, Bld: 97 mg/dL (ref 70–99)
Potassium: 2.8 mEq/L — CL (ref 3.5–5.1)
Sodium: 138 mEq/L (ref 135–145)
Total Bilirubin: 0.3 mg/dL (ref 0.2–1.2)
Total Protein: 7.5 g/dL (ref 6.0–8.3)

## 2021-08-17 LAB — HEMOGLOBIN A1C: Hgb A1c MFr Bld: 5.6 % (ref 4.6–6.5)

## 2021-08-17 MED ORDER — CHLORTHALIDONE 25 MG PO TABS: 25.0000 mg | ORAL_TABLET | Freq: Every day | ORAL | 3 refills | Status: DC

## 2021-08-17 MED ORDER — SPIRONOLACTONE 25 MG PO TABS: 25.0000 mg | ORAL_TABLET | Freq: Every day | ORAL | 3 refills | Status: DC

## 2021-08-17 MED ORDER — ALBUTEROL SULFATE HFA 108 (90 BASE) MCG/ACT IN AERS: 1.0000 | INHALATION_SPRAY | Freq: Four times a day (QID) | RESPIRATORY_TRACT | 0 refills | Status: DC | PRN

## 2021-08-17 MED ORDER — POTASSIUM CHLORIDE CRYS ER 10 MEQ PO TBCR: 10.0000 meq | EXTENDED_RELEASE_TABLET | Freq: Two times a day (BID) | ORAL | 0 refills | Status: DC

## 2021-08-17 NOTE — Assessment & Plan Note (Signed)
Restratification labs associated ?Encourage exercise and diet ?

## 2021-08-17 NOTE — Telephone Encounter (Signed)
Called and spoke to patient regarding lab results. Informed patient of provider instructions and comments. Pt verbalized understanding. As, cma  ?

## 2021-08-17 NOTE — Assessment & Plan Note (Signed)
>>  ASSESSMENT AND PLAN FOR BMI 32.0-32.9,ADULT WRITTEN ON 08/17/2021 10:10 AM BY Garnette Gunner, MD  Restratification labs associated Encourage exercise and diet

## 2021-08-17 NOTE — Progress Notes (Signed)
? ?New Patient Office Visit ? ?Subjective   ? ?Patient ID: Jasmine Baird, female    DOB: 1985/04/19  Age: 36 y.o. MRN: [id number] ? ?CC:  ?Chief Complaint  ?Patient presents with  ? Establish Care  ?  Np wants to discus BP meds ?Pt would like a referral to GYN  ? ? ?HPI ?Jasmine Baird presents to establish care. ? ?She has a history of chronic hypertension and preeclampsia with pregnancy complications including miscarriage. ?Patient has been on chlorthalidone 25 mg for hypertension.  She has tried other agents including beta-blockers, ACE inhibitors which led to anaphylaxis in both.  Also tried amlodipine, however this was not tolerated well due to swelling. ? ?Patient has had mild hypokalemia, where she was treated with OTC potassium supplements. ? ?Patient denies any chest pain, shortness of breath, lower extremity swelling, headache, vision changes, palpitations. ? ?Patient has history of fibroids, she is followed with OB/GYN in the past, she is interested in referral. ? ?Patient has asthma and seasonal allergies.  Asthma is controlled with albuterol inhaler as needed.  Seasonal allergies are controlled with OTC Zyrtec and Flonase as needed.  She reports that both these are well controlled right now. ? ? ? ? ?Outpatient Encounter Medications as of 08/17/2021  ?Medication Sig  ? cetirizine (ZYRTEC) 10 MG tablet Take 10 mg by mouth daily.  ? fluticasone (FLONASE) 50 MCG/ACT nasal spray Place 2 sprays into both nostrils as needed.  ? spironolactone (ALDACTONE) 25 MG tablet Take 1 tablet (25 mg total) by mouth daily.  ? [DISCONTINUED] albuterol (PROVENTIL HFA;VENTOLIN HFA) 108 (90 Base) MCG/ACT inhaler Inhale 1-2 puffs into the lungs every 6 (six) hours as needed for wheezing or shortness of breath.  ? [DISCONTINUED] chlorthalidone (HYGROTON) 25 MG tablet Take 25 mg by mouth daily.  ? [DISCONTINUED] potassium chloride (MICRO-K) 10 MEQ CR capsule Take 10 mEq by mouth 1 day or 1 dose.  ? albuterol (VENTOLIN HFA)  108 (90 Base) MCG/ACT inhaler Inhale 1-2 puffs into the lungs every 6 (six) hours as needed for wheezing or shortness of breath.  ? chlorthalidone (HYGROTON) 25 MG tablet Take 1 tablet (25 mg total) by mouth daily.  ? [DISCONTINUED] metroNIDAZOLE (FLAGYL) 500 MG tablet Take 1 tablet (500 mg total) by mouth 2 (two) times daily.  ? ?No facility-administered encounter medications on file as of 08/17/2021.  ? ? ?Past Medical History:  ?Diagnosis Date  ? Allergy   ? Fibroids   ? Hypertension   ? ? ?Past Surgical History:  ?Procedure Laterality Date  ? NO PAST SURGERIES    ? ? ?Family History  ?Problem Relation Age of Onset  ? Hypertension Mother   ? Cancer Father   ? Hypertension Father   ? Hyperlipidemia Father   ? Asthma Daughter   ? Allergies Daughter   ? Asthma Son   ? Heart disease Maternal Uncle   ? Hyperlipidemia Maternal Grandmother   ? Diabetes Maternal Grandmother   ? Hypertension Maternal Grandmother   ? Cancer Maternal Grandfather 63  ?     PANCREATIC  ? ? ?Social History  ? ?Socioeconomic History  ? Marital status: Married  ?  Spouse name: Jasmine Baird  ? Number of children: 2  ? Years of education: Not on file  ? Highest education level: Not on file  ?Occupational History  ? Occupation: Paralegal  ?Tobacco Use  ? Smoking status: Never  ? Smokeless tobacco: Never  ?Vaping Use  ? Vaping Use: Never used  ?  Substance and Sexual Activity  ? Alcohol use: No  ? Drug use: No  ? Sexual activity: Yes  ?  Birth control/protection: Condom  ?Other Topics Concern  ? Not on file  ?Social History Narrative  ? 1. Son, Jasmine Baird, 35 yo,   ? 2. Daughter, Jasmine Baird, 62 yo   ? ?Social Determinants of Health  ? ?Financial Resource Strain: Not on file  ?Food Insecurity: Not on file  ?Transportation Needs: Not on file  ?Physical Activity: Not on file  ?Stress: Not on file  ?Social Connections: Not on file  ?Intimate Partner Violence: Not on file  ? ? ?ROS ?As per HPI ?  ? ? ?Objective   ? ?BP (!) 154/104 (BP Location: Left Arm, Cuff  Size: Large)   Pulse 82   Temp (!) 97 ?F (36.1 ?C) (Temporal)   Ht _0  (1.549 m)   Wt 173 lb 9.6 oz (78.7 kg)   LMP 08/14/2021   SpO2 97%   BMI 32.80 kg/m?  ? ?Physical Exam ?Gen: NAD, resting comfortably ?CV: RRR with no murmurs appreciated ?Pulm: NWOB, CTAB with no crackles, wheezes, or rhonchi ?GI: Normal bowel sounds present. Soft, Nontender, Nondistended. ?MSK: no edema, cyanosis, or clubbing noted ?Skin: warm, dry ?Neuro: grossly normal, moves all extremities ?Psych: Normal affect and thought content ? ? ?  ? ?Assessment & Plan:  ? ?Problem List Items Addressed This Visit   ? ?  ? Cardiovascular and Mediastinum  ? Chronic hypertension  ?  Chronic, asymptomatic ?Currently taking chlorthalidone 25 mg ?Intolerant of beta-blockers and ACE inhibitors due to anaphylaxis, intolerant of calcium channel blockers due to significant swelling ?Prolonged time, check electrolytes, monitor closely ?Check CMP, restratification labs: Lipid, hemoglobin A1c ? ?  ?  ? Relevant Medications  ? spironolactone (ALDACTONE) 25 MG tablet  ? chlorthalidone (HYGROTON) 25 MG tablet  ? Other Relevant Orders  ? Comp Met (CMET)  ? Hemoglobin A1C  ? Lipid Profile  ?  ? Respiratory  ? Asthma  ?  Chronic ?Well-controlled on albuterol ?Refilled inhaler, 1 to 2 puffs as needed ? ?  ?  ? Relevant Medications  ? albuterol (VENTOLIN HFA) 108 (90 Base) MCG/ACT inhaler  ?  ? Other  ? Seasonal allergies  ?  Chronic, well controlled on OTC Zyrtec and Flonase ? ?  ?  ? History of pre-eclampsia - Primary  ? Relevant Orders  ? Comp Met (CMET)  ? Hemoglobin A1C  ? Lipid Profile  ? Fibroids  ?  Referral to OB/GYN ? ?  ?  ? Relevant Orders  ? Ambulatory referral to Obstetrics / Gynecology  ? BMI 32.0-32.9,adult  ?  Restratification labs associated ?Encourage exercise and diet ? ?  ?  ? Relevant Orders  ? Comp Met (CMET)  ? Hemoglobin A1C  ? Lipid Profile  ? ?Other Visit Diagnoses   ? ? Encounter for hepatitis C screening test for low risk patient       ? Relevant Orders  ? Hepatitis C Antibody  ? ?  ? ? ?Return in about 4 weeks (around 09/14/2021) for BP.  ? ?Bonnita Hollow, MD ? ? ?

## 2021-08-17 NOTE — Assessment & Plan Note (Signed)
Referral to OB/GYN

## 2021-08-17 NOTE — Patient Instructions (Signed)
It was a pleasure to meet you today. ?We are starting spironolactone for blood pressure.  Continue chlorthalidone. ?We are checking routine labs as discussed.  We will call you with results. ?We are referring to OB/GYN for fibroids. ?

## 2021-08-17 NOTE — Assessment & Plan Note (Signed)
Chronic ?Well-controlled on albuterol ?Refilled inhaler, 1 to 2 puffs as needed ?

## 2021-08-17 NOTE — Assessment & Plan Note (Signed)
Chronic, well controlled on OTC Zyrtec and Flonase ?

## 2021-08-17 NOTE — Assessment & Plan Note (Addendum)
Chronic, asymptomatic ?Currently taking chlorthalidone 25 mg ?Intolerant of beta-blockers and ACE inhibitors due to anaphylaxis, intolerant of calcium channel blockers due to significant swelling ?Prolonged time, check electrolytes, monitor closely ?Check CMP, restratification labs: Lipid, hemoglobin A1c ?

## 2021-08-20 LAB — HEPATITIS C ANTIBODY
Hepatitis C Ab: NONREACTIVE
SIGNAL TO CUT-OFF: 0.07 (ref <1.00)

## 2021-08-21 IMAGING — US US OB COMP LESS 14 WK
1 series · 15 of 28 positions shown · non-contrast
Comparison: None.

CLINICAL DATA: Bleeding for 2 days

EXAM:
OBSTETRIC <14 WK US AND TRANSVAGINAL OB US
TECHNIQUE: Both transabdominal and transvaginal ultrasound examinations were
performed for complete evaluation of the gestation as well as the
maternal uterus, adnexal regions, and pelvic cul-de-sac.
Transvaginal technique was performed to assess early pregnancy.

[Series 1: us ob comp less 14 wk · 15 of 54 slices shown]
[im 1/54]
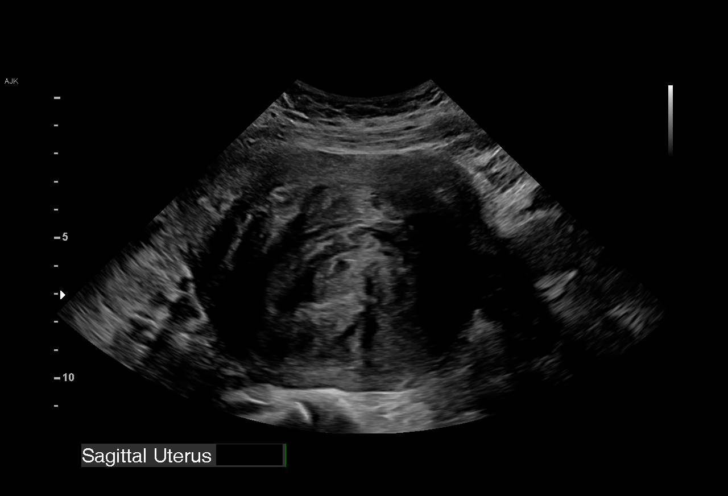
[im 4/54]
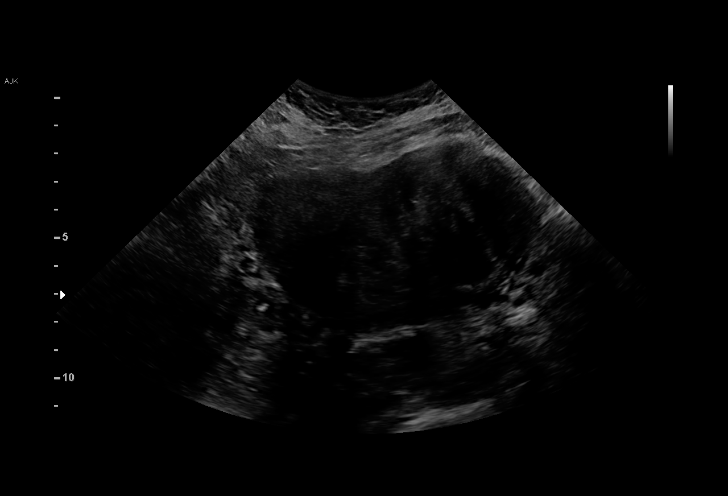
[im 8/54]
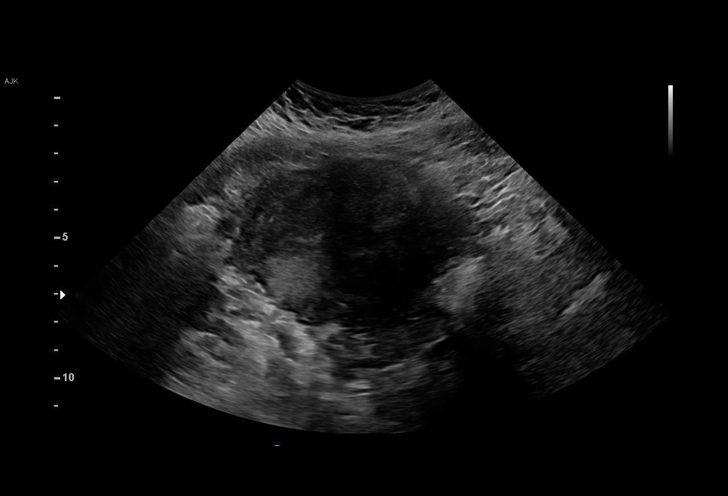
[im 12/54]
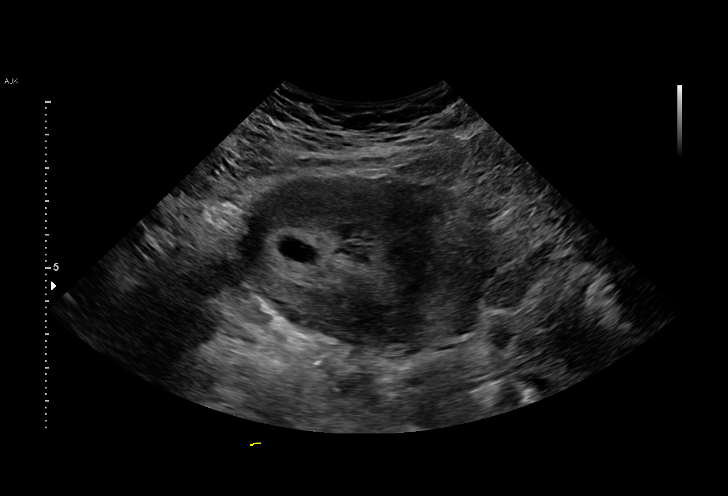
[im 16/54]
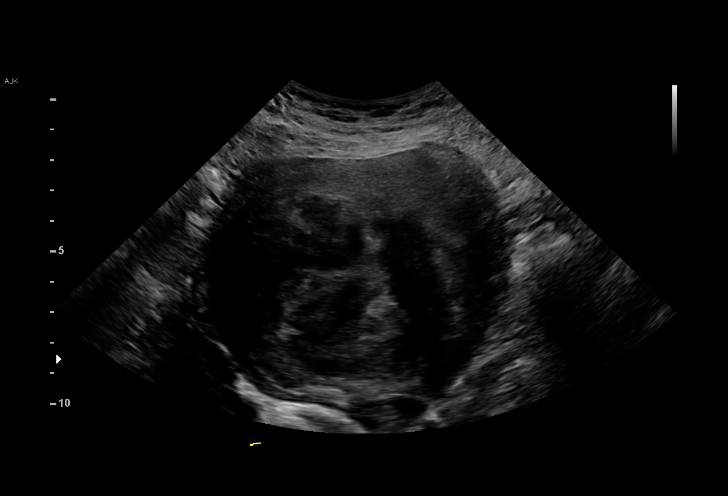
[im 20/54]
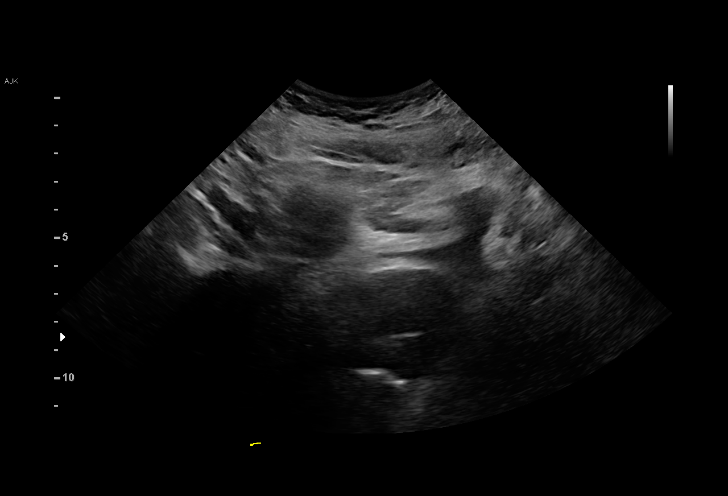
[im 24/54]
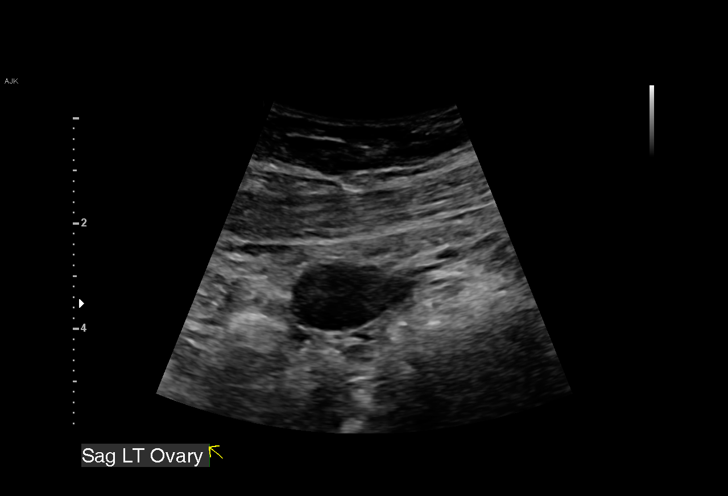
[im 28/54]
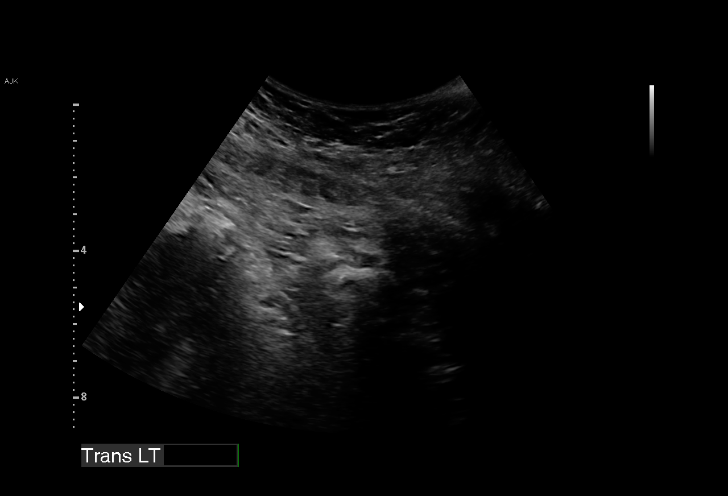
[im 30/54]
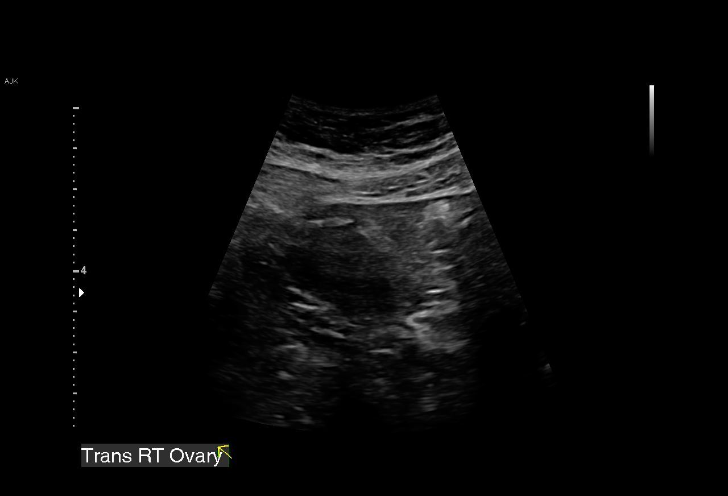
[im 34/54]
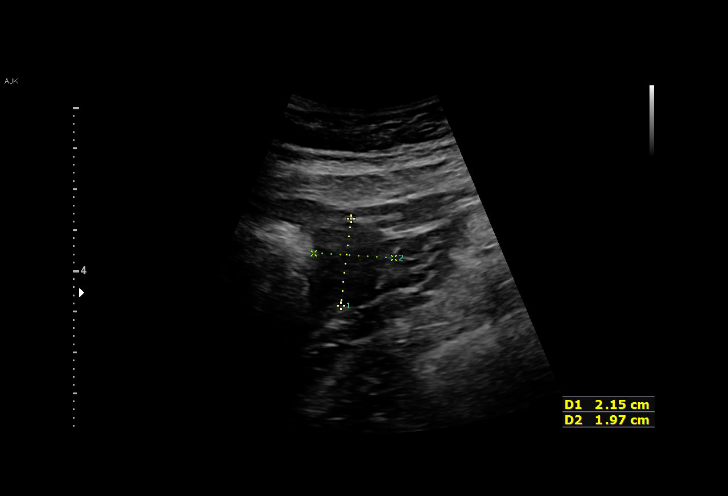
[im 38/54]
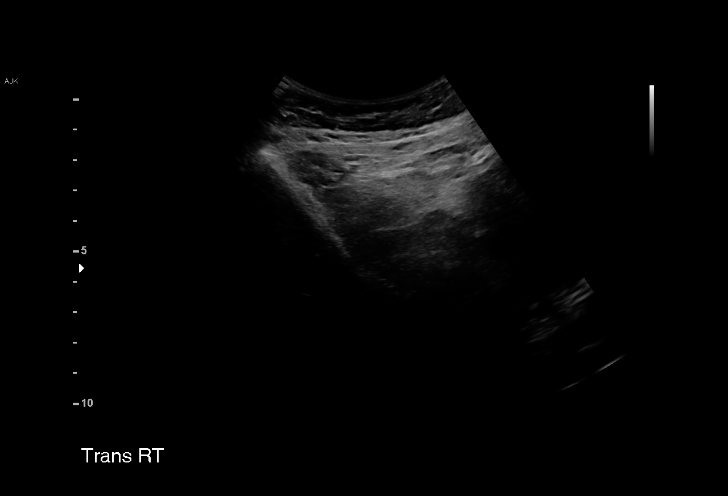
[im 42/54]
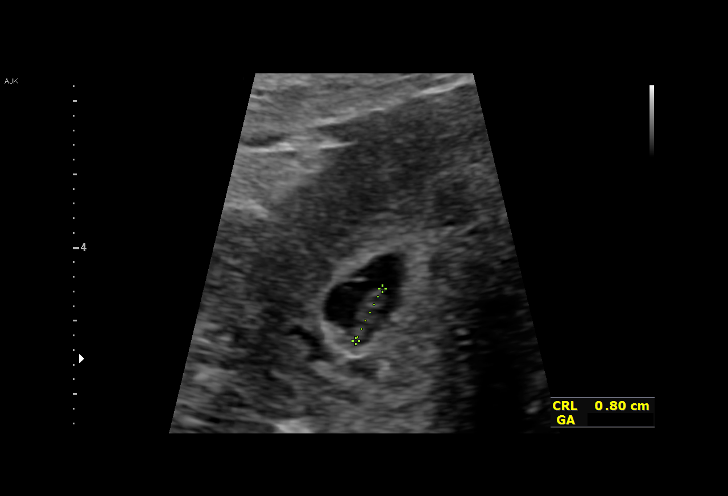
[im 46/54]
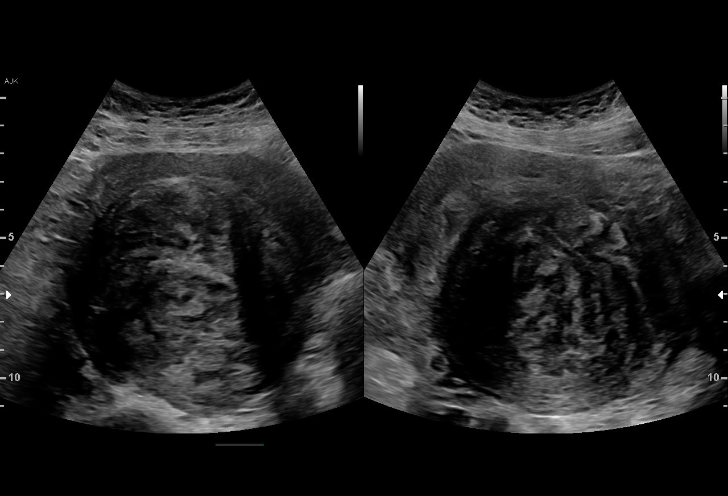
[im 50/54]
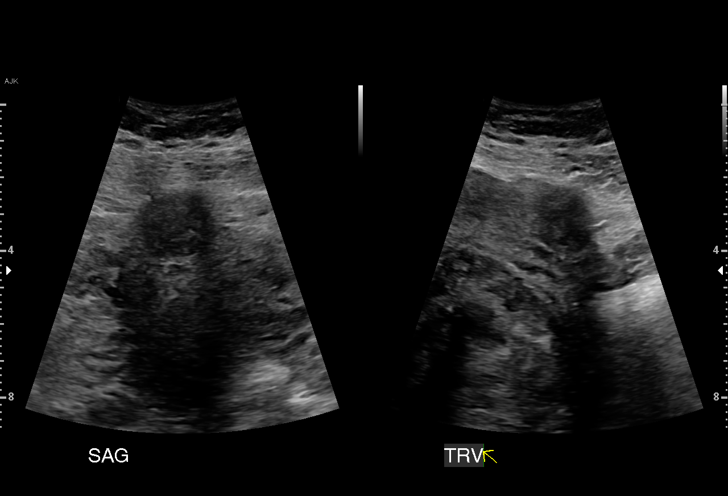
[im 54/54]
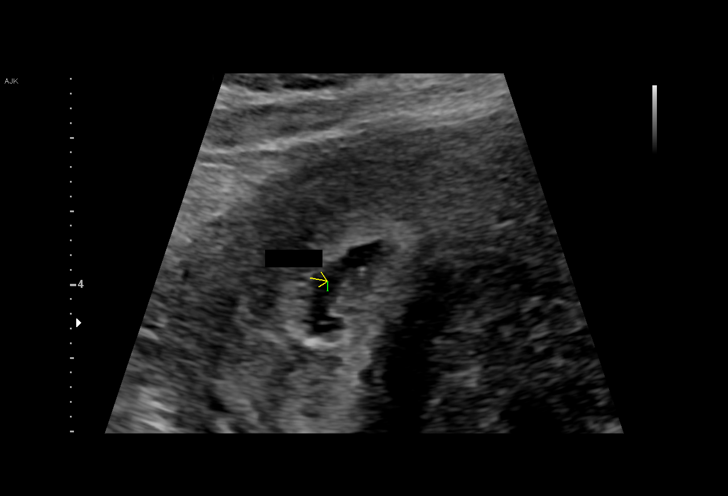

[15 of 28 positions shown; findings below may reference images not displayed]

FINDINGS: Intrauterine gestational sac: Single

Yolk sac:  Visualized.

Embryo:  Visualized.

Cardiac Activity: Visualized.

Heart Rate: 141  bpm

CRL:  8.3 mm   6 w   5 d                  US EDC: 07/28/2020

Subchorionic hemorrhage:  None visualized.

Maternal uterus/adnexae: Within the right ovary there is a corpus
luteum noted. Numerous heterogeneous intrauterine fibroids are seen.
The largest in the posterior fundus measuring 8.2 x 7.8 x 8.7 cm
IMPRESSION: Single live intrauterine pregnancy measuring 6 weeks 5 days

Multiple intrauterine fibroids

## 2021-08-22 ENCOUNTER — Other Ambulatory Visit (INDEPENDENT_AMBULATORY_CARE_PROVIDER_SITE_OTHER): Payer: BC Managed Care – PPO

## 2021-08-22 DIAGNOSIS — E876 Hypokalemia: Secondary | ICD-10-CM | POA: Diagnosis not present

## 2021-08-22 LAB — COMPREHENSIVE METABOLIC PANEL
ALT: 12 U/L (ref 0–35)
AST: 14 U/L (ref 0–37)
Albumin: 4.4 g/dL (ref 3.5–5.2)
Alkaline Phosphatase: 97 U/L (ref 39–117)
BUN: 12 mg/dL (ref 6–23)
CO2: 30 mEq/L (ref 19–32)
Calcium: 9.5 mg/dL (ref 8.4–10.5)
Chloride: 99 mEq/L (ref 96–112)
Creatinine, Ser: 0.86 mg/dL (ref 0.40–1.20)
GFR: 86.49 mL/min (ref >60.00)
Glucose, Bld: 114 mg/dL — ABNORMAL HIGH (ref 70–99)
Potassium: 3 mEq/L — ABNORMAL LOW (ref 3.5–5.1)
Sodium: 138 mEq/L (ref 135–145)
Total Bilirubin: 0.3 mg/dL (ref 0.2–1.2)
Total Protein: 7.3 g/dL (ref 6.0–8.3)

## 2021-08-22 NOTE — Progress Notes (Signed)
Potassium is still low, but improved. We will need to stop the hydrochlorothiazide, continue on spironolactone. Patient continue with posticum supplements for 3 more days, then follow up with me in 1 week

## 2021-08-25 ENCOUNTER — Encounter: Payer: Self-pay | Admitting: Family Medicine

## 2021-08-25 DIAGNOSIS — E876 Hypokalemia: Secondary | ICD-10-CM

## 2021-08-27 MED ORDER — POTASSIUM CHLORIDE CRYS ER 20 MEQ PO TBCR: 20.0000 meq | EXTENDED_RELEASE_TABLET | Freq: Two times a day (BID) | ORAL | 2 refills | Status: DC

## 2021-08-30 ENCOUNTER — Ambulatory Visit (INDEPENDENT_AMBULATORY_CARE_PROVIDER_SITE_OTHER): Payer: BC Managed Care – PPO | Admitting: Family Medicine

## 2021-08-30 ENCOUNTER — Encounter: Payer: Self-pay | Admitting: Family Medicine

## 2021-08-30 VITALS — BP 151/100 | HR 97 | Temp 96.9°F | Ht 61.0 in | Wt 173.6 lb

## 2021-08-30 DIAGNOSIS — I10 Essential (primary) hypertension: Secondary | ICD-10-CM | POA: Diagnosis not present

## 2021-08-30 DIAGNOSIS — E876 Hypokalemia: Secondary | ICD-10-CM | POA: Insufficient documentation

## 2021-08-30 LAB — BASIC METABOLIC PANEL
BUN: 13 mg/dL (ref 6–23)
CO2: 27 mEq/L (ref 19–32)
Calcium: 9.5 mg/dL (ref 8.4–10.5)
Chloride: 101 mEq/L (ref 96–112)
Creatinine, Ser: 0.83 mg/dL (ref 0.40–1.20)
GFR: 90.24 mL/min (ref >60.00)
Glucose, Bld: 102 mg/dL — ABNORMAL HIGH (ref 70–99)
Potassium: 3.6 mEq/L (ref 3.5–5.1)
Sodium: 137 mEq/L (ref 135–145)

## 2021-08-30 LAB — MAGNESIUM: Magnesium: 2 mg/dL (ref 1.5–2.5)

## 2021-08-30 MED ORDER — POTASSIUM CHLORIDE CRYS ER 10 MEQ PO TBCR: 10.0000 meq | EXTENDED_RELEASE_TABLET | Freq: Two times a day (BID) | ORAL | 3 refills | Status: DC

## 2021-08-30 MED ORDER — CHLORTHALIDONE 25 MG PO TABS: 12.5000 mg | ORAL_TABLET | Freq: Every day | ORAL | 3 refills | Status: DC

## 2021-08-30 MED ORDER — SPIRONOLACTONE 50 MG PO TABS: 50.0000 mg | ORAL_TABLET | Freq: Every day | ORAL | 0 refills | Status: DC

## 2021-08-30 NOTE — Assessment & Plan Note (Signed)
Has history of this, Likely due to chlorthalidone Patient asymptomatic Increase spironolactone 50 mg Repeat BMP Magnesium Follow-up 1 month

## 2021-08-30 NOTE — Progress Notes (Signed)
   Jasmine Baird is a 36 y.o. female who presents today for an office visit.  Assessment/Plan:  New/Acute Problems:   Chronic Problems Addressed Today: Chronic hypertension Unstable Asymptomatic Chlorthalidone decreased to 12.5 mg daily due to persistent hypokalemia Increasing spironolactone to 50 mg daily Follow-up 1 month  Hypokalemia Has history of this, Likely due to chlorthalidone Patient asymptomatic Increase spironolactone 50 mg Repeat BMP Magnesium Follow-up 1 month     Subjective:  HPI:  Jasmine Baird is a 36 y.o. female who presents with  Chief Complaint  Patient presents with   Follow-up    2 week follow up discuss labs and bp meds   Patient had was potassium of 2.8, approximately 2 weeks ago, patient medications was changed including decreasing chlorthalidone to 12.5 mg, starting spironolactone 25 mg, and starting potassium supplements 10 mEq twice daily Potassium was slightly improved after this to 3.0 a few days later Patient denies any chest pain, shortness of breath, palpitations, lower extremity swelling, headaches. Patient actually states that she likes the current combination of medications including reduce swelling in hands and not having to take 20 mill equivalents due to the size of the potassium pill.       Objective:  Physical Exam: BP (!) 151/100 (BP Location: Left Arm, Patient Position: Sitting, Cuff Size: Normal)   Pulse 97   Temp (!) 96.9 F (36.1 C) (Temporal)   Ht '5\' 1"'$  (1.549 m)   Wt 173 lb 9.6 oz (78.7 kg)   LMP 08/14/2021   SpO2 99%   BMI 32.80 kg/m   Gen: No acute distress, resting comfortably CV: Regular rate and rhythm with no murmurs appreciated Pulm: Normal work of breathing, clear to auscultation bilaterally with no crackles, wheezes, or rhonchi MSK: No lower extremity swelling Neuro: Grossly normal, moves all extremities Psych: Normal affect and thought content       08/30/2021 9:20 AM

## 2021-08-30 NOTE — Assessment & Plan Note (Signed)
Unstable Asymptomatic Chlorthalidone decreased to 12.5 mg daily due to persistent hypokalemia Increasing spironolactone to 50 mg daily Follow-up 1 month

## 2021-08-30 NOTE — Patient Instructions (Signed)
We are rechecking potassium today.  Increase spironolactone to 50 mg a day, decrease chlorthalidone to 12.5 mg a day, continue with potassium supplements.

## 2021-10-01 ENCOUNTER — Ambulatory Visit (INDEPENDENT_AMBULATORY_CARE_PROVIDER_SITE_OTHER): Payer: BC Managed Care – PPO | Admitting: Family Medicine

## 2021-10-01 ENCOUNTER — Encounter: Payer: Self-pay | Admitting: Family Medicine

## 2021-10-01 VITALS — BP 136/88 | HR 70 | Temp 97.7°F | Wt 175.0 lb

## 2021-10-01 DIAGNOSIS — E876 Hypokalemia: Secondary | ICD-10-CM

## 2021-10-01 DIAGNOSIS — I10 Essential (primary) hypertension: Secondary | ICD-10-CM | POA: Diagnosis not present

## 2021-10-01 NOTE — Progress Notes (Signed)
   Emony Shao Fehr is a 36 y.o. female who presents today for an office visit.  Assessment/Plan:  New/Acute Problems:   Chronic Problems Addressed Today: No problem-specific Assessment & Plan notes found for this encounter.     Subjective:  HPI:  Hypertension, established problem, Stable BP Readings from Last 3 Encounters:  10/01/21 136/88  08/30/21 (!) 151/100  08/17/21 (!) 154/104   Home BP monitoring: None Current Medications: Spironolactone and chlorthalidone, compliant without side effects. Interim History: Improved blood pressure and resolution of hypokalemia  ROS: Denies any chest pain, shortness of breath, dyspnea on exertion, leg edema.            Objective:  Physical Exam: BP 136/88 (BP Location: Left Arm, Patient Position: Sitting, Cuff Size: Large)   Pulse 70   Temp 97.7 F (36.5 C) (Temporal)   Wt 175 lb (79.4 kg)   LMP 09/24/2021   SpO2 98%   BMI 33.07 kg/m   Gen: No acute distress, resting comfortably CV: Regular rate and rhythm with no murmurs appreciated Pulm: Normal work of breathing, clear to auscultation bilaterally with no crackles, wheezes, or rhonchi Neuro: Grossly normal, moves all extremities Psych: Normal affect and thought content      Garner Nash, MD, MS

## 2021-11-09 ENCOUNTER — Other Ambulatory Visit (HOSPITAL_COMMUNITY)
Admission: RE | Admit: 2021-11-09 | Discharge: 2021-11-09 | Disposition: A | Discharge status: Home / Self Care | Payer: BC Managed Care – PPO | Source: Ambulatory Visit | Attending: Obstetrics & Gynecology | Admitting: Obstetrics & Gynecology

## 2021-11-09 ENCOUNTER — Encounter: Payer: Self-pay | Admitting: Obstetrics & Gynecology

## 2021-11-09 ENCOUNTER — Ambulatory Visit: Payer: BC Managed Care – PPO | Admitting: Obstetrics & Gynecology

## 2021-11-09 VITALS — BP 137/80 | HR 91 | Ht 61.0 in | Wt 177.0 lb

## 2021-11-09 DIAGNOSIS — D219 Benign neoplasm of connective and other soft tissue, unspecified: Secondary | ICD-10-CM

## 2021-11-09 DIAGNOSIS — Z01419 Encounter for gynecological examination (general) (routine) without abnormal findings: Secondary | ICD-10-CM | POA: Insufficient documentation

## 2021-11-09 DIAGNOSIS — N920 Excessive and frequent menstruation with regular cycle: Secondary | ICD-10-CM

## 2021-11-09 NOTE — Patient Instructions (Signed)
Please take with food  Ibuprofen 800 mg three times a day  OR Naproxen 550 mg two times a day   Other option that can be added: Lysteda 1300 mg three times a day (prescription)

## 2021-11-09 NOTE — Progress Notes (Signed)
GYNECOLOGY ANNUAL PREVENTATIVE CARE ENCOUNTER NOTE  History:     Jasmine Baird is a 36 y.o. [phone] female here for a routine annual gynecologic exam and to discuss her concerns about her fibroids and heavy menstrual periods.  Known history of fibroids, she denies any pain or pressure but has noticed prolonged and heavier menstrual periods. Periods last 8-10 days with heavy flow.  No symptoms of anemia.  She wants to discuss management of her periods, not interested in surgery at this point.   Denies abnormal vaginal discharge, pelvic pain, problems with intercourse or other gynecologic concerns.    Gynecologic History Patient's last menstrual period was 10/20/2021 (exact date). Contraception: condoms Last Pap: 02/27/2015. Result was normal   Obstetric History OB History  Gravida Para Term Preterm AB Living  '4 2 1 1 1 2  '$ SAB IAB Ectopic Multiple Live Births  0 1 0 0 2    # Outcome Date GA Lbr Len/2nd Weight Sex Delivery Anes PTL Lv  4 Gravida           3 Term 09/07/15 65w2d03:28 / 00:07 7 lb 2.5 oz (3.246 kg) F Vag-Spont EPI  LIV  2 Preterm 08/09/06 317w0d4 lb 9 oz (2.07 kg) M Vag-Spont  Y LIV     Complications: Pre-eclampsia  1 IAB             Past Medical History:  Diagnosis Date   Fibroids    Hypertension     Past Surgical History:  Procedure Laterality Date   NO PAST SURGERIES      Current Outpatient Medications on File Prior to Visit  Medication Sig Dispense Refill   albuterol (VENTOLIN HFA) 108 (90 Base) MCG/ACT inhaler Inhale 1-2 puffs into the lungs every 6 (six) hours as needed for wheezing or shortness of breath. 1 each 0   cetirizine (ZYRTEC) 10 MG tablet Take 10 mg by mouth daily.     chlorthalidone (HYGROTON) 25 MG tablet Take 25 mg by mouth daily. Patient takes 12.5 mg daily.     fluticasone (FLONASE) 50 MCG/ACT nasal spray Place 2 sprays into both nostrils as needed.     potassium chloride (KLOR-CON M) 10 MEQ tablet Take 10 mEq by mouth 2 (two)  times daily.     spironolactone (ALDACTONE) 50 MG tablet Take 1 tablet (50 mg total) by mouth daily. 90 tablet 0   No current facility-administered medications on file prior to visit.    Allergies  Allergen Reactions   Beta Adrenergic Blockers Anaphylaxis   Lisinopril Anaphylaxis    Any beta blockers same reaction   Amlodipine Other (See Comments)    Swelling in hands    Social History:  reports that she has never smoked. She has never used smokeless tobacco. She reports that she does not drink alcohol and does not use drugs.  Family History  Problem Relation Age of Onset   Hypertension Mother    Cancer Father    Hypertension Father    Hyperlipidemia Father    Asthma Daughter    Allergies Daughter    Asthma Son    Heart disease Maternal Uncle    Hyperlipidemia Maternal Grandmother    Diabetes Maternal Grandmother    Hypertension Maternal Grandmother    Cancer Maternal Grandfather 67       PANCREATIC    The following portions of the patient's history were reviewed and updated as appropriate: allergies, current medications, past family history, past medical history, past  social history, past surgical history and problem list.  Review of Systems Pertinent items noted in HPI and remainder of comprehensive ROS otherwise negative.  Physical Exam:  BP 137/80   Pulse 91   Ht '5\' 1"'$  (1.549 m)   Wt 177 lb (80.3 kg)   LMP 10/20/2021 (Exact Date)   BMI 33.44 kg/m  CONSTITUTIONAL: Well-developed, well-nourished female in no acute distress.  HENT:  Normocephalic, atraumatic, External right and left ear normal.  EYES: Conjunctivae and EOM are normal. Pupils are equal, round, and reactive to light. No scleral icterus.  NECK: Normal range of motion, supple, no masses.  Normal thyroid.  SKIN: Skin is warm and dry. No rash noted. Not diaphoretic. No erythema. No pallor. MUSCULOSKELETAL: Normal range of motion. No tenderness.  No cyanosis, clubbing, or edema. NEUROLOGIC: Alert and  oriented to person, place, and time. Normal reflexes, muscle tone coordination.  PSYCHIATRIC: Normal mood and affect. Normal behavior. Normal judgment and thought content. CARDIOVASCULAR: Normal heart rate noted, regular rhythm RESPIRATORY: Clear to auscultation bilaterally. Effort and breath sounds normal, no problems with respiration noted. BREASTS: Symmetric in size. No masses, tenderness, skin changes, nipple drainage, or lymphadenopathy bilaterally. Performed in the presence of a chaperone. ABDOMEN: Soft, no distention noted.  No tenderness, rebound or guarding.  PELVIC: Normal appearing external genitalia and urethral meatus; normal appearing vaginal mucosa and cervix.  No abnormal vaginal discharge noted.  Pap smear obtained.  Enlarged uterine size, no other palpable masses, no uterine or adnexal tenderness.  Performed in the presence of a chaperone.   Assessment and Plan:     1. Fibroids 2. Menorrhagia with regular cycle Discussed medical management options for abnormal uterine bleeding including NSAIDs (Naproxen), tranexamic acid (Lysteda), hormonal management (different modalities).  Discussed risks and benefits of each method.   Patient desires trial of NSAIDs now, will do OTC formulation of Ibuprofen or Naproxen.  Proper dosages described for patient. Will monitor response. Pelvic ultrasound ordered, will follow up results and manage accordingly.  Printed patient education handouts were given to the patient to review at home.  Bleeding precautions reviewed.  - US PELVIC COMPLETE WITH TRANSVAGINAL; Future  3. Encounter for gynecological examination with Papanicolaou smear of cervix - Cytology - PAP( [facility]) Will follow up results of pap smear and manage accordingly. Routine preventative health maintenance measures emphasized. Please refer to After Visit Summary for other counseling recommendations.      Verita Schneiders, MD, [place] for [employer], [place]

## 2021-11-13 LAB — CYTOLOGY - PAP
Comment: NEGATIVE
Diagnosis: NEGATIVE
High risk HPV: NEGATIVE

## 2021-11-15 ENCOUNTER — Other Ambulatory Visit: Payer: Self-pay

## 2021-11-15 ENCOUNTER — Encounter: Payer: Self-pay | Admitting: Obstetrics & Gynecology

## 2021-11-15 DIAGNOSIS — N760 Acute vaginitis: Secondary | ICD-10-CM

## 2021-11-15 DIAGNOSIS — B9689 Other specified bacterial agents as the cause of diseases classified elsewhere: Secondary | ICD-10-CM

## 2021-11-15 MED ORDER — METRONIDAZOLE 500 MG PO TABS: 500.0000 mg | ORAL_TABLET | Freq: Two times a day (BID) | ORAL | 0 refills | Status: DC

## 2021-11-16 ENCOUNTER — Ambulatory Visit (HOSPITAL_BASED_OUTPATIENT_CLINIC_OR_DEPARTMENT_OTHER)
Admission: RE | Admit: 2021-11-16 | Discharge: 2021-11-16 | Disposition: A | Discharge status: Home / Self Care | Payer: BC Managed Care – PPO | Source: Ambulatory Visit | Attending: Obstetrics & Gynecology | Admitting: Obstetrics & Gynecology

## 2021-11-16 DIAGNOSIS — N920 Excessive and frequent menstruation with regular cycle: Secondary | ICD-10-CM | POA: Insufficient documentation

## 2021-11-16 DIAGNOSIS — N83201 Unspecified ovarian cyst, right side: Secondary | ICD-10-CM | POA: Diagnosis not present

## 2021-11-16 DIAGNOSIS — D259 Leiomyoma of uterus, unspecified: Secondary | ICD-10-CM | POA: Diagnosis not present

## 2021-11-16 DIAGNOSIS — D219 Benign neoplasm of connective and other soft tissue, unspecified: Secondary | ICD-10-CM

## 2021-11-17 NOTE — Progress Notes (Unsigned)
GYNECOLOGY OFFICE VISIT NOTE  History:   Jasmine Baird is a 36 y.o. [phone] here today for follow up from her [place] and for discussion of fibroids. She had known fibroids with her last pregnancy.  She was seen for her annual and wanted to discuss options for the fibroids.   Periods are 8-10 days and heavy. No signs/symptoms of anemia. She has no pain or pressure symptoms. Her bleeding mostly changed after she had a miscarriage. Sometimes her periods are painful but not consistently.   She uses condoms for  birth control - pt does not want any more children.     Past Medical History:  Diagnosis Date   Fibroids    Hypertension     Past Surgical History:  Procedure Laterality Date   NO PAST SURGERIES      The following portions of the patient's history were reviewed and updated as appropriate: allergies, current medications, past family history, past medical history, past social history, past surgical history and problem list.   Health Maintenance:   Normal pap and negative HRHPV on 11/09/2021.   Diagnosis  Date Value Ref Range Status  11/09/2021   Final   - Negative for intraepithelial lesion or malignancy (NILM)    Review of Systems:  Pertinent items noted in HPI and remainder of comprehensive ROS otherwise negative.  Physical Exam:  BP 121/83   Pulse 86   Ht '5\' 1"'$  (1.549 m)   Wt 175 lb (79.4 kg)   LMP 10/20/2021 (Exact Date)   BMI 33.07 kg/m  CONSTITUTIONAL: Well-developed, well-nourished female in no acute distress.  HEENT:  Normocephalic, atraumatic. External right and left ear normal. No scleral icterus.  NECK: Normal range of motion, supple, no masses noted on observation SKIN: No rash noted. Not diaphoretic. No erythema. No pallor. MUSCULOSKELETAL: Normal range of motion. No edema noted. NEUROLOGIC: Alert and oriented to person, place, and time. Normal muscle tone coordination. No cranial nerve deficit noted. PSYCHIATRIC: Normal mood and affect. Normal  behavior. Normal judgment and thought content.  CARDIOVASCULAR: Normal heart rate noted RESPIRATORY: Effort and breath sounds normal, no problems with respiration noted ABDOMEN: No masses noted. No other overt distention noted.    PELVIC: Deferred  Labs and Imaging No results found for this or any previous visit (from the past 168 hour(s)). US PELVIC COMPLETE WITH TRANSVAGINAL  Result Date: 11/16/2021 CLINICAL DATA:  Menorrhagia, history of uterine fibroids EXAM: TRANSABDOMINAL AND TRANSVAGINAL ULTRASOUND OF PELVIS TECHNIQUE: Both transabdominal and transvaginal ultrasound examinations of the pelvis were performed. Transabdominal technique was performed for global imaging of the pelvis including uterus, ovaries, adnexal regions, and pelvic cul-de-sac. It was necessary to proceed with endovaginal exam following the transabdominal exam to visualize the endometrium and ovaries. COMPARISON:  None Available. FINDINGS: Uterus Measurements: 8.7 x 9.8 x 9.5 cm = volume: 425 mL. There is inhomogeneous echogenicity in myometrium. There is 7 x 5 x 4.9 cm fibroid in the anterior body. Lays 5.7 x 6.4 x 4.7 cm fibroid in the posterior body. Endometrium Thickness: 5 mm.  No focal abnormality visualized. Right ovary Measurements: 5.2 x 4.1 x 3.7 cm = volume: 41.5 mL. There is 4 x 3.6 x 3 cm complex cyst with thick internal septation. Internal septum measures up to 4 mm in thickness. Left ovary Measurements: 3.6 x 2.9 x 3.5 cm = volume: 19.6 mL. Normal appearance/no adnexal mass. Other findings No abnormal free fluid. IMPRESSION: There is inhomogeneous echogenicity in myometrium with uterine fibroids. There is 4  cm complex cyst in the right ovary. Follow-up pelvic sonogram in 2-3 months may be considered to assess resolution. Electronically Signed   By: Elmer Picker M.D.   On: 11/16/2021 19:58     I reviewed the [place] images myself. The anterior fibroid is closer to the fundus/midbody and is subserosal. The  posterior fibroid appears to communicate with the endometrial cavity and appears largely transmural but also with a large component that is subserosal - best seen on image #13. Her lining is not thickened. While the left ovarian cyst is complex I suspect that will resolve spontaneously - there is one septation but it is echogenic and appears simple fluid filled without any solid components. Views are best seen by TA as TV images are very limited.   Assessment and Plan:   1. Fibroids - Uterine fibroids: The patient's fibroids are symptomatic and treatment options of expectant management, medical therapy, and surgical therapy were discussed. - Expectant management - The patient's fibroids were discussed and expectant management was offered with strict precautions. - TXA - this medication was discussed as a means to control vaginal bleeding. Discussed it would not impact growth of the fibroids either way. Its main advantage is avoiding hormonal or surgical therapy and gives an option for therapy besides expectant management. It can also be combined with other options. - We discussed progesterone only options including - POP, Depo Provera and Lng-IUD (Mirena). The fact that this type of therapy does not contain estrogen was discussed with the patient.  Proper use was also discussed. We reviewed possible induction of amenorrhea with each options as well as the possibility of break through bleeding with initial use. .  - We discussed the GnRH-agonists and antagonists(Oriahnn, Myfembree). Reviewed both short term and long term impact of these medications both on bleeding and fibroid reduction. - We discussed surgical/procedural options available: RFA (I.e. Sonata), [place], myomectomy and hysterectomy. We discussed the risks and benefits for each of these specific procedures. For [place], recommended preop MRI and referral to interventional radiology.  For the sonata, reviewed that we would need to sign a special consent  form for this procedure. We discussed the types and sizes of fibroids that are candidates for hysteroscopic resection of fibroids as well. Based on fibroid size and location I do not think she would be a great candidate for sonata and it would not be possible to do hysteroscopic resection. We discussed if no plans for child bearing, hysterectomy is preferred over myomectomy. Discussed risks of hysterectomy.  - Following counseling, the patient would like to  consider her options and contact us.  I spent 39 minutes in chart review of her prior visit with Dr. Harolyn Rutherford, review of her pap smear, reviewing her ultrasound images myself, counseling the patient on her options and documentation.   Routine preventative health maintenance measures emphasized. Please refer to After Visit Summary for other counseling recommendations.   Return in about 1 year (around 11/20/2022), or if symptoms worsen or fail to improve, for annual.  Radene Gunning, MD, [place] for [place]

## 2021-11-19 ENCOUNTER — Ambulatory Visit: Payer: BC Managed Care – PPO | Admitting: Obstetrics and Gynecology

## 2021-11-19 ENCOUNTER — Encounter: Payer: Self-pay | Admitting: Obstetrics and Gynecology

## 2021-11-19 VITALS — BP 121/83 | HR 86 | Ht 61.0 in | Wt 175.0 lb

## 2021-11-19 DIAGNOSIS — D219 Benign neoplasm of connective and other soft tissue, unspecified: Secondary | ICD-10-CM

## 2021-11-20 MED ORDER — MYFEMBREE 40-1-0.5 MG PO TABS
1.0000 | ORAL_TABLET | Freq: Every day | ORAL | 1 refills | Status: DC
Start: 2021-11-20 — End: 2022-01-18

## 2021-11-21 ENCOUNTER — Telehealth: Payer: Self-pay | Admitting: General Practice

## 2021-11-21 NOTE — Telephone Encounter (Signed)
Pt called to confirm appt on 12/14/2021 at 10:35am with Dr. Radene Gunning.

## 2021-12-14 ENCOUNTER — Ambulatory Visit: Payer: BC Managed Care – PPO | Admitting: Obstetrics and Gynecology

## 2021-12-14 ENCOUNTER — Encounter: Payer: Self-pay | Admitting: Obstetrics and Gynecology

## 2021-12-14 VITALS — BP 132/95 | HR 95 | Wt 172.0 lb

## 2021-12-14 DIAGNOSIS — Z30430 Encounter for insertion of intrauterine contraceptive device: Secondary | ICD-10-CM

## 2021-12-14 LAB — POCT URINE PREGNANCY: Preg Test, Ur: NEGATIVE

## 2021-12-14 MED ORDER — LEVONORGESTREL 20.1 MCG/DAY IU IUD
1.0000 | INTRAUTERINE_SYSTEM | Freq: Once | INTRAUTERINE | Status: AC
  Administered 2021-12-14: 1 via INTRAUTERINE

## 2021-12-14 NOTE — Progress Notes (Signed)
    GYNECOLOGY OFFICE PROCEDURE NOTE  Jasmine Baird is a 36 y.o. [id number] here for IUD insertion. No GYN concerns.  Last pap smear was on 11/09/21 and was normal.  IUD Insertion Procedure Note Procedure: IUD insertion with [place]ile[place] UPT: Negative GC/CT testing: Up to date  Patient identified.  Risks, benefits and alternatives of procedure were discussed including irregular bleeding, cramping, infection, malpositioning or misplacement of the IUD outside the uterus which may require further procedure such as laparoscopy. Also discussed >99% contraception efficacy, increased risk of ectopic pregnancy with failure of method.   Emphasized that this did not protect against STIs, condoms recommended during all sexual encounters. Consent signed. Time out performed.   Speculum inserted and cervix visualized, prepped with 3 swabs of betadine.   Grasped with a single tooth tenaculum, Uterus sounded to 8 cm.  Cervical os finders used. IUD then inserted without difficulty per manufacturer's instructions and strings cut to 3 cm below cervical os and all instruments removed. Pt tolerated well with minimal pain and bleeding.   Discussed concerning signs/symptoms and to call if heavy bleeding, severe abdominal pain, or fever in the following 3 weeks. Manufacturer pamphlet/patient information given. Reviewed timing of efficacy for contraception and to use an alternative form of birth control until that time.   Radene Gunning, MD, [place] for [facility], [place]

## 2021-12-25 DIAGNOSIS — Z713 Dietary counseling and surveillance: Secondary | ICD-10-CM | POA: Diagnosis not present

## 2022-01-14 ENCOUNTER — Other Ambulatory Visit (HOSPITAL_COMMUNITY): Payer: Self-pay

## 2022-01-15 DIAGNOSIS — Z23 Encounter for immunization: Secondary | ICD-10-CM | POA: Diagnosis not present

## 2022-01-18 ENCOUNTER — Telehealth: Payer: Self-pay | Admitting: Family Medicine

## 2022-01-18 ENCOUNTER — Ambulatory Visit: Payer: BC Managed Care – PPO | Admitting: Family Medicine

## 2022-01-18 ENCOUNTER — Encounter: Payer: Self-pay | Admitting: Family Medicine

## 2022-01-18 VITALS — BP 134/84 | HR 80 | Temp 97.8°F | Wt 172.4 lb

## 2022-01-18 DIAGNOSIS — E876 Hypokalemia: Secondary | ICD-10-CM

## 2022-01-18 DIAGNOSIS — J4520 Mild intermittent asthma, uncomplicated: Secondary | ICD-10-CM | POA: Diagnosis not present

## 2022-01-18 DIAGNOSIS — I10 Essential (primary) hypertension: Secondary | ICD-10-CM | POA: Diagnosis not present

## 2022-01-18 LAB — BASIC METABOLIC PANEL
BUN: 16 mg/dL (ref 6–23)
CO2: 29 mEq/L (ref 19–32)
Calcium: 9.7 mg/dL (ref 8.4–10.5)
Chloride: 99 mEq/L (ref 96–112)
Creatinine, Ser: 0.94 mg/dL (ref 0.40–1.20)
GFR: 77.51 mL/min (ref >60.00)
Glucose, Bld: 93 mg/dL (ref 70–99)
Potassium: 3.8 mEq/L (ref 3.5–5.1)
Sodium: 134 mEq/L — ABNORMAL LOW (ref 135–145)

## 2022-01-18 MED ORDER — SPIRONOLACTONE 50 MG PO TABS: 50.0000 mg | ORAL_TABLET | Freq: Every day | ORAL | 0 refills | Status: DC

## 2022-01-18 MED ORDER — MONTELUKAST SODIUM 10 MG PO TABS: 10.0000 mg | ORAL_TABLET | Freq: Every day | ORAL | 3 refills | Status: DC

## 2022-01-18 MED ORDER — ALBUTEROL SULFATE HFA 108 (90 BASE) MCG/ACT IN AERS: 1.0000 | INHALATION_SPRAY | Freq: Four times a day (QID) | RESPIRATORY_TRACT | 0 refills | Status: DC | PRN

## 2022-01-18 NOTE — Patient Instructions (Signed)
We refilled spironalactone, albuterol, and moteluekast.  WE are checking potassium.  If asthma worsens, please follow up in clinic in 2 weeks. If severe go to  [facility] Department.

## 2022-01-18 NOTE — Assessment & Plan Note (Signed)
Normal exam today Multiple uses of inhaler over the past week Continue albuterol inhaler Singulair 10 mg Return precautions discussed if no improvement

## 2022-01-18 NOTE — Assessment & Plan Note (Signed)
Stable Currently on chlorthalidone 12.5 mg, spironolactone 50 mg Has multiple allergies to other agents including beta-blockers, ACE inhibitors, amlodipine Has complications from chlorthalidone causing hypokalemia in the past Continue spironolactone, if potassium low, recommend increasing chlorthalidone, and increasing spironolactone Patient follow-up in 1 month

## 2022-01-18 NOTE — Telephone Encounter (Signed)
Left patient a detailed voice message advising her we reached out to pharmacy and they stated that they have 3 prescriptions ready for pick up at [facility] on [address] in [place], [place].

## 2022-01-18 NOTE — Progress Notes (Signed)
Assessment/Plan:   Problem List Items Addressed This Visit       Cardiovascular and Mediastinum   Chronic hypertension    Stable Currently on chlorthalidone 12.5 mg, spironolactone 50 mg Has multiple allergies to other agents including beta-blockers, ACE inhibitors, amlodipine Has complications from chlorthalidone causing hypokalemia in the past Continue spironolactone, if potassium low, recommend increasing chlorthalidone, and increasing spironolactone Patient follow-up in 1 month      Relevant Medications   spironolactone (ALDACTONE) 50 MG tablet   Other Relevant Orders   Basic Metabolic Panel (BMET)     Respiratory   Asthma - Primary    Normal exam today Multiple uses of inhaler over the past week Continue albuterol inhaler Singulair 10 mg Return precautions discussed if no improvement      Relevant Medications   albuterol (VENTOLIN HFA) 108 (90 Base) MCG/ACT inhaler   montelukast (SINGULAIR) 10 MG tablet     Other   Hypokalemia   Relevant Medications   spironolactone (ALDACTONE) 50 MG tablet   Other Relevant Orders   Basic Metabolic Panel (BMET)       Subjective:  HPI:  Jasmine Baird is a 36 y.o. female who has Chronic hypertension; Asthma; Seasonal allergies; History of pre-eclampsia; Fibroids; BMI 32.0-32.9,adult; and Hypokalemia on their problem list..   She  has a past medical history of Fibroids and Hypertension..   She presents with chief complaint of Rx refill (B/p and inhaler refill. ) .   Hypertension, established problem,  Stable BP Readings from Last 3 Encounters:  01/18/22 134/84  12/14/21 (!) 132/95  11/19/21 121/83   Home BP monitoring:  Current Medications: Reports that she is continue taking chlorthalidone 12.5 mg, spironolactone 50 mg,, compliant without side effects. Interim History: Patient also has a history of hypokalemia, no complaint of muscle cramps, palpitations  ROS: Denies any chest pain, shortness of breath,  dyspnea on exertion, leg edema.    Asthma Follow-up: She has previously been evaluated here for asthma and presents for an asthma follow-up; she is not currently in exacerbation. Symptoms currently include productive cough and wheezing and occur at night over the past week. Observed precipitants include no identifiable factor.  Current limitations in activity from asthma: none.  She denies any other URI symptoms including dyspnea, chest pain, fevers, chills, rhinitis.  Number of days of school or work missed in the last month: none. Number of Emergency Department visits in the previous month: none. Frequency of use of quick-relief meds: Nightly. The patient reports adherence to this regimen  Past Surgical History:  Procedure Laterality Date   NO PAST SURGERIES      Outpatient Medications Prior to Visit  Medication Sig Dispense Refill   cetirizine (ZYRTEC) 10 MG tablet Take 10 mg by mouth daily.     chlorthalidone (HYGROTON) 25 MG tablet Take 25 mg by mouth daily. Patient takes 12.5 mg daily.     fluticasone (FLONASE) 50 MCG/ACT nasal spray Place 2 sprays into both nostrils as needed.     potassium chloride (KLOR-CON M) 10 MEQ tablet Take 10 mEq by mouth 2 (two) times daily.     albuterol (VENTOLIN HFA) 108 (90 Base) MCG/ACT inhaler Inhale 1-2 puffs into the lungs every 6 (six) hours as needed for wheezing or shortness of breath. 1 each 0   Relugolix-Estradiol-Norethind (MYFEMBREE) 40-1-0.5 MG TABS Take 1 tablet by mouth daily. 90 tablet 1   spironolactone (ALDACTONE) 50 MG tablet Take 1 tablet (50 mg total) by mouth daily. [place]  tablet 0   No facility-administered medications prior to visit.    Family History  Problem Relation Age of Onset   Hypertension Mother    Cancer Father    Hypertension Father    Hyperlipidemia Father    Asthma Daughter    Allergies Daughter    Asthma Son    Heart disease Maternal Uncle    Hyperlipidemia Maternal Grandmother    Diabetes Maternal Grandmother     Hypertension Maternal Grandmother    Cancer Maternal Grandfather 81       PANCREATIC    Social History   Socioeconomic History   Marital status: Married    Spouse name: Advertising account executive   Number of children: 2   Years of education: Not on file   Highest education level: Not on file  Occupational History   Occupation: Paralegal  Tobacco Use   Smoking status: Never   Smokeless tobacco: Never  Vaping Use   Vaping Use: Never used  Substance and Sexual Activity   Alcohol use: No   Drug use: No   Sexual activity: Yes    Birth control/protection: Condom  Other Topics Concern   Not on file  Social History Narrative   1. Son, [name], 71 yo,    2. Daughter, [name], 84 yo    Social Determinants of Radio broadcast assistant Strain: Not on file  Food Insecurity: Not on file  Transportation Needs: Not on file  Physical Activity: Not on file  Stress: Not on file  Social Connections: Not on file  Intimate Partner Violence: Not on file                                                                                                 Objective:  Physical Exam: BP 134/84 (BP Location: Left Arm, Patient Position: Sitting, Cuff Size: Large)   Pulse 80   Temp 97.8 F (36.6 C) (Temporal)   Wt 172 lb 6.4 oz (78.2 kg)   LMP  (LMP Unknown)   SpO2 99%   BMI 32.57 kg/m    General: No acute distress. Awake and conversant.  Eyes: Normal conjunctiva, anicteric. Round symmetric pupils.  ENT: Hearing grossly intact. No nasal discharge.  Neck: Neck is supple. No masses or thyromegaly.  Respiratory: Respirations are non-labored. No auditory wheezing.  CTA B Skin: Warm. No rashes or ulcers.  Psych: Alert and oriented. Cooperative, Appropriate mood and affect, Normal judgment.  CV: No cyanosis or JVD, RRR, no MRG ABD: Nontender, nondistended MSK: Normal ambulation. No clubbing, no lower extremity edema Neuro: Sensation and CN II-XII grossly normal.        Alesia Banda,  MD, MS

## 2022-01-18 NOTE — Telephone Encounter (Signed)
[facility] told pt that none of her prescriptions were sent to them. albuterol (VENTOLIN HFA) 108 (90 Base) MCG/ACT inhaler [[id number] montelukast (SINGULAIR) 10 MG tablet [[id number]]  spironolactone (ALDACTONE) 50 MG tablet [[id number]]

## 2022-02-26 DIAGNOSIS — Z713 Dietary counseling and surveillance: Secondary | ICD-10-CM | POA: Diagnosis not present

## 2022-04-17 ENCOUNTER — Encounter: Payer: Self-pay | Admitting: Family Medicine

## 2022-04-17 ENCOUNTER — Ambulatory Visit: Payer: BC Managed Care – PPO | Admitting: Family Medicine

## 2022-04-17 VITALS — BP 124/82 | HR 80 | Temp 97.5°F | Wt 170.8 lb

## 2022-04-17 DIAGNOSIS — E876 Hypokalemia: Secondary | ICD-10-CM | POA: Diagnosis not present

## 2022-04-17 DIAGNOSIS — I10 Essential (primary) hypertension: Secondary | ICD-10-CM | POA: Diagnosis not present

## 2022-04-17 LAB — BASIC METABOLIC PANEL
BUN: 15 mg/dL (ref 6–23)
CO2: 30 mEq/L (ref 19–32)
Calcium: 10 mg/dL (ref 8.4–10.5)
Chloride: 101 mEq/L (ref 96–112)
Creatinine, Ser: 0.93 mg/dL (ref 0.40–1.20)
GFR: 78.38 mL/min (ref >60.00)
Glucose, Bld: 91 mg/dL (ref 70–99)
Potassium: 4 mEq/L (ref 3.5–5.1)
Sodium: 139 mEq/L (ref 135–145)

## 2022-04-17 MED ORDER — SPIRONOLACTONE 100 MG PO TABS: 100.0000 mg | ORAL_TABLET | Freq: Every day | ORAL | 0 refills | Status: DC

## 2022-04-17 NOTE — Progress Notes (Signed)
Assessment/Plan:   Problem List Items Addressed This Visit       Cardiovascular and Mediastinum   Chronic hypertension - Primary    Patient's hypertension is currently well controlled as evidenced by today's reading. Continue monitoring and adjust treatment as indicated by blood pressure and symptoms.  Plan: Reduce the number of medications while maintaining blood pressure control and avoiding hypokalemia. Discontinue Chlorthalidone (HYGROTON) and Potassium Chloride (KLOR-CON M) based on recent stability and acceptable potassium levels. Increase Spironolactone (ALDACTONE) to 100 mg if today's labs show stable potassium levels. Monitor blood pressure daily for two weeks and report any significant increase in blood pressure or swelling to the physician. If the patient experiences any issues with the new regimen, she is advised to revert to the original dosing and schedule an appointment for reevaluation.      Relevant Orders   Basic Metabolic Panel (BMET) (Completed)     Other   Hypokalemia   Relevant Orders   Basic Metabolic Panel (BMET) (Completed)    Medications Discontinued During This Encounter  Medication Reason   chlorthalidone (HYGROTON) 25 MG tablet    potassium chloride (KLOR-CON M) 10 MEQ tablet    spironolactone (ALDACTONE) 50 MG tablet Reorder      Subjective:  CHIEF COMPLAINT: The patient presents for a follow-up on hypertension and queries about medication adjustments pertaining to blood pressure, potassium levels, and associated swelling.  HISTORY OF PRESENT ILLNESS:  Problem 1: Jasmine Baird, a 37 year old female with a history of chronic hypertension, presents for medication refill and management. She is currently on Chlorthalidone and Spironolactone for hypertension control and to balance low potassium and fluid retention. The patient reports being stable with blood pressure measurements being good today at 124/82. Recent laboratory tests in October show  normal kidney function and potassium levels.  Problem 2: The patient has also experienced issues with hypokalemia in the past, which has been managed with potassium supplementation. She inquires if it is necessary to continue potassium given her recent stability. The physician discusses the delicate balance of medications and the potential risks of hypokalemia including heart problems if potassium levels are not maintained.  REVIEW OF SYSTEMS: Cardiovascular: Blood pressure controlled on current medication regimen. No reports of chest pain or palpitations. Respiratory: No complaints of shortness of breath; uses an albuterol inhaler as needed. Musculoskeletal: No notable muscle weakness or cramps. All other systems reviewed and negative aside from the patient's known chronic conditions.  Past Surgical History:  Procedure Laterality Date   NO PAST SURGERIES      Outpatient Medications Prior to Visit  Medication Sig Dispense Refill   albuterol (VENTOLIN HFA) 108 (90 Base) MCG/ACT inhaler Inhale 1-2 puffs into the lungs every 6 (six) hours as needed for wheezing or shortness of breath. 1 each 0   cetirizine (ZYRTEC) 10 MG tablet Take 10 mg by mouth daily.     fluticasone (FLONASE) 50 MCG/ACT nasal spray Place 2 sprays into both nostrils as needed.     montelukast (SINGULAIR) 10 MG tablet Take 1 tablet (10 mg total) by mouth at bedtime. 30 tablet 3   chlorthalidone (HYGROTON) 25 MG tablet Take 25 mg by mouth daily. Patient takes 12.5 mg daily.     potassium chloride (KLOR-CON M) 10 MEQ tablet Take 10 mEq by mouth 2 (two) times daily.     spironolactone (ALDACTONE) 50 MG tablet Take 1 tablet (50 mg total) by mouth daily. 90 tablet 0   No facility-administered medications prior to visit.  Family History  Problem Relation Age of Onset   Hypertension Mother    Cancer Father    Hypertension Father    Hyperlipidemia Father    Asthma Daughter    Allergies Daughter    Asthma Son    Heart  disease Maternal Uncle    Hyperlipidemia Maternal Grandmother    Diabetes Maternal Grandmother    Hypertension Maternal Grandmother    Cancer Maternal Grandfather 67       PANCREATIC    Social History   Socioeconomic History   Marital status: Married    Spouse name: Advertising account executive   Number of children: 2   Years of education: Not on file   Highest education level: Not on file  Occupational History   Occupation: Paralegal  Tobacco Use   Smoking status: Never   Smokeless tobacco: Never  Vaping Use   Vaping Use: Never used  Substance and Sexual Activity   Alcohol use: No   Drug use: No   Sexual activity: Yes    Birth control/protection: Condom  Other Topics Concern   Not on file  Social History Narrative   1. Son, Jasmine Baird, 50 yo,    2. Daughter, Jasmine Baird, 26 yo    Social Determinants of Health   Financial Resource Strain: Not on file  Food Insecurity: Not on file  Transportation Needs: Not on file  Physical Activity: Not on file  Stress: Not on file  Social Connections: Not on file  Intimate Partner Violence: Not on file                                                                                                 Objective:  Physical Exam: BP 124/82 (BP Location: Left Arm, Patient Position: Sitting, Cuff Size: Large)   Pulse 80   Temp (!) 97.5 F (36.4 C) (Temporal)   Wt 170 lb 12.8 oz (77.5 kg)   SpO2 97%   BMI 32.27 kg/m    General: No acute distress. Awake and conversant.  Eyes: Normal conjunctiva, anicteric. Round symmetric pupils.  ENT: Hearing grossly intact. No nasal discharge.  Neck: Neck is supple. No masses or thyromegaly.  Respiratory: Respirations are non-labored. No auditory wheezing.  Skin: Warm. No rashes or ulcers.  Psych: Alert and oriented. Cooperative, Appropriate mood and affect, Normal judgment.  CV: No cyanosis or JVD MSK: Normal ambulation. No clubbing  Neuro: Sensation and CN II-XII grossly normal.   LABS: Reviewed previous  labs including Basic Metabolic Panel from October showing normal renal function and potassium levels.      Alesia Banda, MD, MS

## 2022-04-17 NOTE — Patient Instructions (Addendum)
Medication Adjustments:  Discontinue Potassium Supplements: Stop taking the potassium supplements you have been on. Discontinue Hydrochlorothiazide (HCTZ): Cease taking the HCTZ as well. Increase Spironolactone: Double your current dose of spironolactone from 50 mg to 100 mg daily. Monitoring:  Blood Pressure: Check your blood pressure at home every day for the next two weeks. Keep a record of the readings. Swelling: Monitor for any changes in swelling, particularly in the hands and legs. Follow-up Schedule:  If blood pressure and swelling are stable with no significant increase or new symptoms, schedule a follow-up visit in one month (February). If you experience adverse changes such as increased swelling or significant changes in blood pressure within a few days after adjusting medication, revert to your previous medication regimen (50 mg spironolactone, 25 mg Hydrochlorothiazide, and the potassium supplement). Under these circumstances, schedule an earlier follow-up or a virtual visit within two weeks to reassess and adjust your treatment plan as needed. Signs to Watch For:  Pay attention to urination frequency and other urinary symptoms. Take note of any new or worsening symptoms such as chest pain, shortness of breath, leg swelling, blurry vision, or palpitations. Additional Tests:  You will have repeat lab work done today to check current levels and baseline before making changes. If the lab results indicate your potassium levels are within a normal range, you can proceed with the medication adjustments. General Precautions:  As always, it's important to maintain a balanced diet and hydration, especially with the changes in medication that can affect fluid and electrolyte balance. During cold and flu season, continue to practice good hygiene and precautions to limit exposure to illness.  Should you have any questions or concerns about these instructions, symptoms, or your  medications, do not hesitate to contact the office for clarification or to address any issues. Your health and safety are of utmost importance as we strive to optimize your treatment plan.

## 2022-04-18 ENCOUNTER — Encounter: Payer: Self-pay | Admitting: Family Medicine

## 2022-04-18 DIAGNOSIS — E876 Hypokalemia: Secondary | ICD-10-CM

## 2022-04-18 DIAGNOSIS — I10 Essential (primary) hypertension: Secondary | ICD-10-CM

## 2022-04-18 MED ORDER — SPIRONOLACTONE 50 MG PO TABS: 100.0000 mg | ORAL_TABLET | Freq: Every day | ORAL | 0 refills | Status: DC

## 2022-04-18 MED ORDER — CHLORTHALIDONE 25 MG PO TABS: 25.0000 mg | ORAL_TABLET | Freq: Every day | ORAL | 0 refills | Status: DC

## 2022-04-21 NOTE — Assessment & Plan Note (Signed)
Patient's hypertension is currently well controlled as evidenced by today's reading. Continue monitoring and adjust treatment as indicated by blood pressure and symptoms.  Plan: Reduce the number of medications while maintaining blood pressure control and avoiding hypokalemia. Discontinue Chlorthalidone (HYGROTON) and Potassium Chloride (KLOR-CON M) based on recent stability and acceptable potassium levels. Increase Spironolactone (ALDACTONE) to 100 mg if today's labs show stable potassium levels. Monitor blood pressure daily for two weeks and report any significant increase in blood pressure or swelling to the physician. If the patient experiences any issues with the new regimen, she is advised to revert to the original dosing and schedule an appointment for reevaluation.

## 2022-05-15 ENCOUNTER — Other Ambulatory Visit: Payer: Self-pay | Admitting: Family Medicine

## 2022-05-15 DIAGNOSIS — I10 Essential (primary) hypertension: Secondary | ICD-10-CM

## 2022-05-15 DIAGNOSIS — E876 Hypokalemia: Secondary | ICD-10-CM

## 2022-06-01 ENCOUNTER — Encounter: Payer: Self-pay | Admitting: Obstetrics and Gynecology

## 2022-06-03 ENCOUNTER — Encounter: Payer: Self-pay | Admitting: Family Medicine

## 2022-06-04 ENCOUNTER — Other Ambulatory Visit: Payer: Self-pay

## 2022-06-04 MED ORDER — FLUCONAZOLE 150 MG PO TABS: 150.0000 mg | ORAL_TABLET | Freq: Once | ORAL | 0 refills | Status: AC

## 2022-06-17 ENCOUNTER — Other Ambulatory Visit: Payer: Self-pay | Admitting: Family Medicine

## 2022-06-17 DIAGNOSIS — I10 Essential (primary) hypertension: Secondary | ICD-10-CM

## 2022-06-17 DIAGNOSIS — E876 Hypokalemia: Secondary | ICD-10-CM

## 2022-06-17 MED ORDER — CETIRIZINE HCL 10 MG PO TABS: 10.0000 mg | ORAL_TABLET | Freq: Every day | ORAL | 3 refills | Status: DC

## 2022-06-17 MED ORDER — FLUTICASONE PROPIONATE 50 MCG/ACT NA SUSP: 2.0000 | Freq: Every day | NASAL | 11 refills | Status: DC

## 2022-06-17 MED ORDER — SPIRONOLACTONE 50 MG PO TABS: 100.0000 mg | ORAL_TABLET | Freq: Every day | ORAL | 0 refills | Status: DC

## 2022-07-04 ENCOUNTER — Encounter: Payer: Self-pay | Admitting: Obstetrics and Gynecology

## 2022-07-09 ENCOUNTER — Ambulatory Visit (HOSPITAL_BASED_OUTPATIENT_CLINIC_OR_DEPARTMENT_OTHER)
Admission: RE | Admit: 2022-07-09 | Discharge: 2022-07-09 | Disposition: A | Discharge status: Home / Self Care | Payer: BC Managed Care – PPO | Source: Ambulatory Visit

## 2022-07-09 ENCOUNTER — Ambulatory Visit (INDEPENDENT_AMBULATORY_CARE_PROVIDER_SITE_OTHER): Payer: BC Managed Care – PPO

## 2022-07-09 VITALS — BP 142/93 | HR 102 | Wt 174.0 lb

## 2022-07-09 DIAGNOSIS — T8332XA Displacement of intrauterine contraceptive device, initial encounter: Secondary | ICD-10-CM | POA: Diagnosis not present

## 2022-07-09 DIAGNOSIS — Z30431 Encounter for routine checking of intrauterine contraceptive device: Secondary | ICD-10-CM

## 2022-07-09 DIAGNOSIS — R339 Retention of urine, unspecified: Secondary | ICD-10-CM | POA: Diagnosis not present

## 2022-07-09 DIAGNOSIS — N83202 Unspecified ovarian cyst, left side: Secondary | ICD-10-CM | POA: Diagnosis not present

## 2022-07-09 DIAGNOSIS — D252 Subserosal leiomyoma of uterus: Secondary | ICD-10-CM | POA: Diagnosis not present

## 2022-07-09 LAB — POCT URINALYSIS DIPSTICK
Bilirubin, UA: NEGATIVE
Blood, UA: NEGATIVE
Glucose, UA: NEGATIVE
Ketones, UA: NEGATIVE
Leukocytes, UA: NEGATIVE
Nitrite, UA: NEGATIVE
Protein, UA: NEGATIVE
Spec Grav, UA: 1.01 (ref 1.010–1.025)
Urobilinogen, UA: 0.2 E.U./dL
pH, UA: 7 (ref 5.0–8.0)

## 2022-07-09 NOTE — Progress Notes (Signed)
Pt presents for Liletta IUD check.  IUD inserted 12/14/2021  Pt reports possible UTI.

## 2022-07-09 NOTE — Progress Notes (Signed)
   GYNECOLOGY PROBLEM OFFICE VISIT NOTE  History:  Jasmine Baird is a 37 y.o. [id number] here today for IUD String check and urinary retention.  Patient states she was experiencing some retention, but has been increasing her cranberry juice intake with improvement in symptoms.  Jasmine Baird also expresses some concern with recurrent yeast infections and UTIs.  She states she spoke with Dr. Marcos Eke who recommended she trims her strings.  She reports after insertion she experienced some bleeding, but none for the past 2 months.  She also reports her partner reports some perception of strings, but this resolves with position change. Jasmine Baird denies any abnormal vaginal discharge, bleeding, pelvic pain or discomfort with sex.   Past Medical History:  Diagnosis Date   Fibroids    Hypertension     Past Surgical History:  Procedure Laterality Date   NO PAST SURGERIES      The following portions of the patient's history were reviewed and updated as appropriate: allergies, current medications, past family history, past medical history, past social history, past surgical history and problem list.   Health Maintenance:  Normal pap and negative HRHPV on August 2023.  No mammogram on file d/t age.   Review of Systems:  Genito-Urinary ROS: positive for - feelings of retention Gastrointestinal ROS: negative Objective:  Vitals: BP (!) 142/93   Pulse (!) 102   Wt 174 lb (78.9 kg)   Breastfeeding No   BMI 32.88 kg/m   Physical Exam: Physical Exam Constitutional:      Appearance: Normal appearance.  Genitourinary:     Genitourinary Comments: Moderate amt yellow watery discharge.  Vaginal mucosa pink and with good rugae. Cervix pink, no lesions, cysts, or polyps. Some ecotropic characteristics around the os. No IUD strings visualized.  Attempted to tease out with cytobrush w/o success.  BME with no device palpated at os.   HENT:     Head: Normocephalic and atraumatic.  Eyes:      Conjunctiva/sclera: Conjunctivae normal.  Cardiovascular:     Rate and Rhythm: Normal rate.  Pulmonary:     Effort: Pulmonary effort is normal. No respiratory distress.  Abdominal:     Tenderness: There is no abdominal tenderness.  Musculoskeletal:        General: Normal range of motion.     Cervical back: Normal range of motion.  Neurological:     Mental Status: She is alert and oriented to person, place, and time.  Skin:    General: Skin is warm and dry.  Psychiatric:        Mood and Affect: Mood normal.        Behavior: Behavior normal.  Vitals reviewed. Exam conducted with a chaperone present.      Labs and Imaging: No results found.  Assessment & Plan:  37 year old Urinary Retention IUD Strings Lost   -UA returns negative.  -Will send urine for culture to r/o infection.  -Reviewed exam findings.  -Reassured that common for IUD strings to be lost.  -Patient offered and accepts UPT. Negative findings. -Scheduled for [place] to assess IUD location.  -Patient to follow up as appropriate.    Total face-to-face time with patient: 15 minutes   Gavin Pound, CNM 07/09/2022 11:40 AM

## 2022-07-11 ENCOUNTER — Other Ambulatory Visit: Payer: Self-pay

## 2022-07-11 DIAGNOSIS — T8332XA Displacement of intrauterine contraceptive device, initial encounter: Secondary | ICD-10-CM

## 2022-07-11 LAB — URINE CULTURE

## 2022-07-12 ENCOUNTER — Ambulatory Visit (HOSPITAL_BASED_OUTPATIENT_CLINIC_OR_DEPARTMENT_OTHER)
Admission: RE | Admit: 2022-07-12 | Discharge: 2022-07-12 | Disposition: A | Discharge status: Home / Self Care | Payer: BC Managed Care – PPO | Source: Ambulatory Visit

## 2022-07-12 DIAGNOSIS — Z0389 Encounter for observation for other suspected diseases and conditions ruled out: Secondary | ICD-10-CM | POA: Diagnosis not present

## 2022-07-12 DIAGNOSIS — T8332XA Displacement of intrauterine contraceptive device, initial encounter: Secondary | ICD-10-CM | POA: Diagnosis not present

## 2022-07-15 ENCOUNTER — Telehealth: Payer: Self-pay

## 2022-07-15 NOTE — Telephone Encounter (Signed)
Patient called and would like plan of care from her Xray that was done on Friday. Will route to provider for input. Armandina Stammer RN

## 2022-07-17 ENCOUNTER — Telehealth: Payer: Self-pay

## 2022-07-17 NOTE — Telephone Encounter (Signed)
Attempted to call regarding XRay results. No answer, straight to voicemail.

## 2022-07-22 ENCOUNTER — Other Ambulatory Visit: Payer: Self-pay

## 2022-07-22 DIAGNOSIS — B379 Candidiasis, unspecified: Secondary | ICD-10-CM

## 2022-07-22 MED ORDER — FLUCONAZOLE 150 MG PO TABS: ORAL_TABLET | ORAL | 1 refills | Status: DC

## 2022-07-22 NOTE — Progress Notes (Signed)
Patient sent Mychart message requesting a Rx for yeast. Diflucan 150 mg PO once was sent to her pharmacy.  Brevin Mcfadden l Abundio Teuscher, CMA

## 2022-07-25 DIAGNOSIS — Z713 Dietary counseling and surveillance: Secondary | ICD-10-CM | POA: Diagnosis not present

## 2022-08-11 ENCOUNTER — Other Ambulatory Visit: Payer: Self-pay

## 2022-08-11 DIAGNOSIS — B379 Candidiasis, unspecified: Secondary | ICD-10-CM

## 2022-08-11 MED ORDER — FLUCONAZOLE 150 MG PO TABS: ORAL_TABLET | ORAL | 1 refills | Status: DC

## 2022-08-16 ENCOUNTER — Encounter: Payer: Self-pay | Admitting: Family Medicine

## 2022-08-16 ENCOUNTER — Ambulatory Visit (INDEPENDENT_AMBULATORY_CARE_PROVIDER_SITE_OTHER): Payer: BC Managed Care – PPO | Admitting: Family Medicine

## 2022-08-16 VITALS — BP 134/86 | HR 93 | Temp 97.9°F | Wt 177.2 lb

## 2022-08-16 DIAGNOSIS — I10 Essential (primary) hypertension: Secondary | ICD-10-CM | POA: Diagnosis not present

## 2022-08-16 DIAGNOSIS — E876 Hypokalemia: Secondary | ICD-10-CM

## 2022-08-16 DIAGNOSIS — D219 Benign neoplasm of connective and other soft tissue, unspecified: Secondary | ICD-10-CM | POA: Diagnosis not present

## 2022-08-16 DIAGNOSIS — Z01818 Encounter for other preprocedural examination: Secondary | ICD-10-CM

## 2022-08-16 DIAGNOSIS — Z6833 Body mass index (BMI) 33.0-33.9, adult: Secondary | ICD-10-CM

## 2022-08-16 DIAGNOSIS — E669 Obesity, unspecified: Secondary | ICD-10-CM | POA: Insufficient documentation

## 2022-08-16 MED ORDER — CHLORTHALIDONE 25 MG PO TABS: 25.0000 mg | ORAL_TABLET | Freq: Every day | ORAL | 1 refills | Status: DC

## 2022-08-16 MED ORDER — SPIRONOLACTONE 50 MG PO TABS: 50.0000 mg | ORAL_TABLET | Freq: Every day | ORAL | 1 refills | Status: DC

## 2022-08-16 NOTE — Patient Instructions (Signed)
For blood pressure, please continue using the spironolactone and Valium. For preoperative clearance workup, please come back for fasting labs for cardiac restratification.

## 2022-08-16 NOTE — Progress Notes (Unsigned)
Assessment/Plan:   Problem List Items Addressed This Visit       Cardiovascular and Mediastinum   Chronic hypertension   Relevant Medications   chlorthalidone (HYGROTON) 25 MG tablet   spironolactone (ALDACTONE) 50 MG tablet   Other Relevant Orders   TSH   Lipid panel   Hemoglobin A1c   Microalbumin / creatinine urine ratio   Urinalysis, Routine w reflex microscopic   Vitamin D 1,25 dihydroxy   CBC with Differential/Platelet   Comprehensive metabolic panel     Other   Fibroids   Hypokalemia   Relevant Medications   spironolactone (ALDACTONE) 50 MG tablet   Encounter for preoperative assessment   Class 1 obesity with serious comorbidity in adult - Primary   Relevant Orders   Vitamin D 1,25 dihydroxy    Medications Discontinued During This Encounter  Medication Reason  . chlorthalidone (HYGROTON) 25 MG tablet Reorder  . spironolactone (ALDACTONE) 50 MG tablet Reorder    Return in about 6 months (around 02/16/2023) for BP.    Subjective:   Encounter date: 08/16/2022  Jasmine Baird is a 37 y.o. female who has Chronic hypertension; Asthma; Seasonal allergies; History of pre-eclampsia; Fibroids; BMI 32.0-32.9,adult; Hypokalemia; Encounter for preoperative assessment; and Class 1 obesity with serious comorbidity in adult on their problem list..   She  has a past medical history of Fibroids and Hypertension..   She presents with chief complaint of Medical Management of Chronic Issues (Spironalactone and chlortalidone rx refill) .   HPI:   Review of Systems  Constitutional:  Negative for chills, diaphoresis, fever, malaise/fatigue and weight loss.  HENT:  Negative for congestion, ear discharge, ear pain and hearing loss.   Eyes:  Negative for blurred vision, double vision, photophobia, pain, discharge and redness.  Respiratory:  Negative for cough, sputum production, shortness of breath and wheezing.   Cardiovascular:  Negative for chest pain and palpitations.   Gastrointestinal:  Negative for abdominal pain, blood in stool, constipation, diarrhea, heartburn, melena, nausea and vomiting.  Genitourinary:  Negative for dysuria, flank pain, frequency, hematuria and urgency.  Musculoskeletal:  Negative for myalgias.  Skin:  Negative for itching and rash.  Neurological:  Negative for dizziness, tingling, tremors, speech change, seizures, loss of consciousness, weakness and headaches.  Psychiatric/Behavioral:  Negative for depression, hallucinations, memory loss, substance abuse and suicidal ideas. The patient does not have insomnia.   All other systems reviewed and are negative.   Past Surgical History:  Procedure Laterality Date  . NO PAST SURGERIES      Outpatient Medications Prior to Visit  Medication Sig Dispense Refill  . albuterol (VENTOLIN HFA) 108 (90 Base) MCG/ACT inhaler Inhale 1-2 puffs into the lungs every 6 (six) hours as needed for wheezing or shortness of breath. 1 each 0  . cetirizine (ZYRTEC) 10 MG tablet Take 1 tablet (10 mg total) by mouth daily. 90 tablet 3  . fluconazole (DIFLUCAN) 150 MG tablet Take 1 tablet by mouth. Repeat in 3 days if symptoms persists. 1 tablet 1  . fluticasone (FLONASE) 50 MCG/ACT nasal spray Place 2 sprays into both nostrils daily. 1 g 11  . montelukast (SINGULAIR) 10 MG tablet Take 1 tablet (10 mg total) by mouth at bedtime. 30 tablet 3  . spironolactone (ALDACTONE) 50 MG tablet Take 2 tablets (100 mg total) by mouth daily. 60 tablet 0  . chlorthalidone (HYGROTON) 25 MG tablet TAKE 1 TABLET (25 MG TOTAL) BY MOUTH DAILY. 30 tablet 0   No facility-administered medications  prior to visit.    Family History  Problem Relation Age of Onset  . Hypertension Mother   . Cancer Father   . Hypertension Father   . Hyperlipidemia Father   . Asthma Daughter   . Allergies Daughter   . Asthma Son   . Heart disease Maternal Uncle   . Hyperlipidemia Maternal Grandmother   . Diabetes Maternal Grandmother   .  Hypertension Maternal Grandmother   . Cancer Maternal Grandfather 29       PANCREATIC    Social History   Socioeconomic History  . Marital status: Legally Separated    Spouse name: Alfonzo Rosebrook  . Number of children: 2  . Years of education: Not on file  . Highest education level: Associate degree: occupational, Scientist, product/process development, or vocational program  Occupational History  . Occupation: Paralegal  Tobacco Use  . Smoking status: Never    Passive exposure: Never  . Smokeless tobacco: Never  Vaping Use  . Vaping Use: Never used  Substance and Sexual Activity  . Alcohol use: No  . Drug use: No  . Sexual activity: Yes    Birth control/protection: I.U.D.  Other Topics Concern  . Not on file  Social History Narrative   1. Son, [name], 33 yo,    2. Daughter, [name], 49 yo    Social Determinants of Health   Financial Resource Strain: Low Risk  (08/15/2022)   Overall Financial Resource Strain (CARDIA)   . Difficulty of Paying Living Expenses: Not very hard  Food Insecurity: No Food Insecurity (08/15/2022)   Hunger Vital Sign   . Worried About Programme researcher, broadcasting/film/video in the Last Year: Never true   . Ran Out of Food in the Last Year: Never true  Transportation Needs: No Transportation Needs (08/15/2022)   PRAPARE - Transportation   . Lack of Transportation (Medical): No   . Lack of Transportation (Non-Medical): No  Physical Activity: Insufficiently Active (08/15/2022)   Exercise Vital Sign   . Days of Exercise per Week: 2 days   . Minutes of Exercise per Session: 50 min  Stress: No Stress Concern Present (08/15/2022)   [employer] of Occupational Health - Occupational Stress Questionnaire   . Feeling of Stress : Only a little  Social Connections: Unknown (08/15/2022)   Social Connection and Isolation Panel [NHANES]   . Frequency of Communication with Friends and Family: More than three times a week   . Frequency of Social Gatherings with Friends and Family: More than three times a  week   . Attends Religious Services: Patient declined   . Active Member of Clubs or Organizations: No   . Attends Banker Meetings: Not on file   . Marital Status: Separated  Intimate Partner Violence: Not on file                                                                                                  Objective:  Physical Exam: BP 134/86 (BP Location: Left Arm, Patient Position: Sitting, Cuff Size: Large)   Pulse 93   Temp 97.9 F (36.6 C) (Temporal)  Wt 177 lb 3.2 oz (80.4 kg)   SpO2 99%   BMI 33.48 kg/m     Physical Exam Constitutional:      General: She is not in acute distress.    Appearance: Normal appearance. She is not ill-appearing or toxic-appearing.  HENT:     Head: Normocephalic and atraumatic.     Nose: Nose normal. No congestion.  Eyes:     General: No scleral icterus.    Extraocular Movements: Extraocular movements intact.  Cardiovascular:     Rate and Rhythm: Normal rate and regular rhythm.     Pulses: Normal pulses.     Heart sounds: Normal heart sounds.  Pulmonary:     Effort: Pulmonary effort is normal. No respiratory distress.     Breath sounds: Normal breath sounds.  Abdominal:     General: Abdomen is flat. Bowel sounds are normal.     Palpations: Abdomen is soft.  Musculoskeletal:        General: Normal range of motion.  Lymphadenopathy:     Cervical: No cervical adenopathy.  Skin:    General: Skin is warm and dry.     Findings: No rash.  Neurological:     General: No focal deficit present.     Mental Status: She is alert and oriented to person, place, and time. Mental status is at baseline.  Psychiatric:        Mood and Affect: Mood normal.        Behavior: Behavior normal.        Thought Content: Thought content normal.        Judgment: Judgment normal.    DG Pelvis 1-2 Views  Result Date: 07/14/2022 CLINICAL DATA:  IUD location EXAM: PELVIS - 1-2 VIEW COMPARISON:  Pelvic ultrasound 07/09/2022 FINDINGS: An IUD  is seen within the central pelvis. There are no fractures or focal calcifications. IMPRESSION: An IUD is seen within the central pelvis. Electronically Signed   By: Darliss Cheney M.D.   On: 07/14/2022 23:57   US PELVIC COMPLETE WITH TRANSVAGINAL  Result Date: 07/09/2022 CLINICAL DATA:  Lost IUD strings EXAM: TRANSABDOMINAL AND TRANSVAGINAL ULTRASOUND OF PELVIS TECHNIQUE: Both transabdominal and transvaginal ultrasound examinations of the pelvis were performed. Transabdominal technique was performed for global imaging of the pelvis including uterus, ovaries, adnexal regions, and pelvic cul-de-sac. It was necessary to proceed with endovaginal exam following the transabdominal exam to visualize the uterus endometrium adnexa. COMPARISON:  Ultrasound 11/16/2021 FINDINGS: Uterus Measurements: 14.4 x 8.8 by 9.2 cm = volume: 607.5 mL. Multiple uterine fibroids. Right uterine fundus exophytic fibroid measuring 9.6 x 9.2 x 7.4 cm. Lower uterine segment fibroid measuring 3.6 by 6.3 x 4.8 cm. Left uterine corpus submucosal fibroid measuring 6.7 x 3.6 by 4.7 cm. Left uterine fundus exophytic fibroid measuring 2.6 x 2.8 x 3.2 cm. Endometrium Poorly visible due to fibroids.  IUD also poorly visible. Right ovary Not seen. Left ovary Measurements: 4.5 x 4.2 x 4.4 cm = volume: 43.5 mL. Anechoic cyst measuring 4.2 x 3.6 x 3.4 cm. Other findings No abnormal free fluid. IMPRESSION: 1. Enlarged fibroid uterus. 2. Poorly visible endometrium and IUD due to the presence of multiple large fibroids, some of which appear calcified. Questionable IUD in the upper uterine segment but not definitive. Consider radiographic correlation to confirm presence of IUD in the pelvis. 3. Nonvisualized right ovary. 4. 4.2 cm benign-appearing left ovarian cyst. No follow up imaging recommended. Note: This recommendation does not apply to premenarchal patients or to those  with increased risk (genetic, family history, elevated tumor markers or other  high-risk factors) of ovarian cancer. Reference: Radiology 2019 Nov; 293(2):359-371. Electronically Signed   By: Jasmine Pang M.D.   On: 07/09/2022 18:08    Recent Results (from the past 2160 hour(s))  Urine Culture     Status: None   Collection Time: 07/09/22 12:00 AM   Specimen: Urine, Clean Catch   Urine  Result Value Ref Range   Urine Culture, Routine Final report    Organism ID, Bacteria Comment     Comment: Culture shows less than 10,000 colony forming units of bacteria per milliliter of urine. This colony count is not generally considered to be clinically significant.   POCT Urinalysis Dipstick     Status: None   Collection Time: 07/09/22 11:35 AM  Result Value Ref Range   Color, UA yellow    Clarity, UA clear    Glucose, UA Negative Negative   Bilirubin, UA negative    Ketones, UA negative    Spec Grav, UA 1.010 1.010 - 1.025   Blood, UA negative    pH, UA 7.0 5.0 - 8.0   Protein, UA Negative Negative   Urobilinogen, UA 0.2 0.2 or 1.0 E.U./dL   Nitrite, UA negative    Leukocytes, UA Negative Negative   Appearance     Odor          Garner Nash, MD, MS

## 2022-08-19 ENCOUNTER — Ambulatory Visit (INDEPENDENT_AMBULATORY_CARE_PROVIDER_SITE_OTHER): Payer: BC Managed Care – PPO | Admitting: Obstetrics and Gynecology

## 2022-08-19 VITALS — BP 142/87 | HR 81 | Ht 61.0 in | Wt 178.0 lb

## 2022-08-19 DIAGNOSIS — D219 Benign neoplasm of connective and other soft tissue, unspecified: Secondary | ICD-10-CM

## 2022-08-19 DIAGNOSIS — T8332XD Displacement of intrauterine contraceptive device, subsequent encounter: Secondary | ICD-10-CM | POA: Diagnosis not present

## 2022-08-19 NOTE — Progress Notes (Signed)
GYNECOLOGY VISIT  Patient name: Jasmine Baird MRN [id number]  Date of birth: 02/03/1986 Chief Complaint:   Fibroids  History:  Jasmine Baird is a 37 y.o. [id number] being seen today for discussion of definitive management of fibroids.  Reports having multiple yeast infections since February. Had an episode of urinary retention, possibly right after a yeast infection. Denies pain with intercourse. Has intermittent lower abdominal pain. Denies issues with BM. She has completed childbearing. No prior surgeries, no known FH of anesthesia complications. Would like to proceed with definitive management of fibroids; reports previously recommend hysterectomy but was not ready at that time and having other issues since IUD place. IUD strings not previously visualized and advised that if she is considering IUD removal best to proceed with definitive management.   Past Medical History:  Diagnosis Date   Fibroids    Hypertension     Past Surgical History:  Procedure Laterality Date   NO PAST SURGERIES      The following portions of the patient's history were reviewed and updated as appropriate: allergies, current medications, past family history, past medical history, past social history, past surgical history and problem list.   Health Maintenance:   Last pap     Component Value Date/Time   DIAGPAP  11/09/2021 0837    - Negative for intraepithelial lesion or malignancy (NILM)   HPVHIGH Negative 11/09/2021 0837   ADEQPAP  11/09/2021 0837    Satisfactory for evaluation; transformation zone component PRESENT.    High Risk HPV: Positive  Adequacy:  Satisfactory for evaluation, transformation zone component PRESENT  Diagnosis:  Atypical squamous cells of undetermined significance (ASC-US)    Review of Systems:  Pertinent items are noted in HPI. Comprehensive review of systems was otherwise negative.   Objective:  Physical Exam BP (!) 142/87   Pulse 81   Ht 5\' 1"  (1.549 m)    Wt 178 lb (80.7 kg)   BMI 33.63 kg/m    Physical Exam Vitals and nursing note reviewed.  Constitutional:      Appearance: Normal appearance.  HENT:     Head: Normocephalic and atraumatic.  Pulmonary:     Effort: Pulmonary effort is normal.  Abdominal:     Comments: Enlarged slightly tender uterus, ~16 wk size  Skin:    General: Skin is warm and dry.  Neurological:     General: No focal deficit present.     Mental Status: She is alert.  Psychiatric:        Mood and Affect: Mood normal.        Behavior: Behavior normal.        Thought Content: Thought content normal.        Judgment: Judgment normal.        Assessment & Plan:   1. Fibroids Patient would like to proceed with definitive management of fibroid uterus.  The IUD has been working well to manage bleeding, patient having some bulk symptoms.  Unclear if episode of urinary retention secondary to enlarged multifibroid uterus.  Discussed risk benefits and alternatives of hysterectomy.  Patient is sure that she would like to proceed with definitive management at this time.  Patient examination consistent with enlarged uterus, slightly tender as well.  Patient able to reiterate that she has completed childbearing as well.  Reviewed postoperative expectations including but not limited to recovery, pelvic rest, and transfer needs for surgical day.  Noted that intention is same-day procedure.  Patient desires surgical management with RA-TLH,  BS, cysto.  The risks of surgery were discussed in detail with the patient including but not limited to: bleeding which may require transfusion or reoperation; infection which may require prolonged hospitalization or re-hospitalization and antibiotic therapy; injury to bowel, bladder, ureters and major vessels or other surrounding organs which may lead to other procedures; formation of adhesions; need for additional procedures including laparotomy or subsequent procedures secondary to intraoperative  injury or abnormal pathology; thromboembolic phenomenon; incisional problems and other postoperative or anesthesia complications.  Patient was told that the likelihood that her condition and symptoms will be treated effectively with this surgical management was very high; the postoperative expectations were also discussed in detail. The patient also understands the alternative treatment options which were discussed in full. All questions were answered.  She was told that she will be contacted by our surgical scheduler regarding the time and date of her surgery; routine preoperative instructions will be given to her by the preoperative nursing team.  Printed patient education handouts about the procedure were given to the patient to review at home.   2. Intrauterine contraceptive device threads lost, subsequent encounter Reviewed that recurrent BV/yeast can occur with IUDs, and that typically will resolve after a few months with IUD in place.  Noted that patient may still have issues with recurrent infection after IUD removal/surgery.  Given current location of IUD, likely unable to be removed in office, will be removed with specimen intraoperatively.   Lorriane Shire, MD Minimally Invasive Gynecologic Surgery [facility] for [facility], [facility]

## 2022-08-19 NOTE — H&P (View-Only) (Signed)
  GYNECOLOGY VISIT  Patient name: Jasmine Baird MRN [id number]  Date of birth: 08/10/1985 Chief Complaint:   Fibroids  History:  Jasmine Baird is a 37 y.o. [id number] being seen today for discussion of definitive management of fibroids.  Reports having multiple yeast infections since February. Had an episode of urinary retention, possibly right after a yeast infection. Denies pain with intercourse. Has intermittent lower abdominal pain. Denies issues with BM. She has completed childbearing. No prior surgeries, no known FH of anesthesia complications. Would like to proceed with definitive management of fibroids; reports previously recommend hysterectomy but was not ready at that time and having other issues since IUD place. IUD strings not previously visualized and advised that if she is considering IUD removal best to proceed with definitive management.   Past Medical History:  Diagnosis Date   Fibroids    Hypertension     Past Surgical History:  Procedure Laterality Date   NO PAST SURGERIES      The following portions of the patient's history were reviewed and updated as appropriate: allergies, current medications, past family history, past medical history, past social history, past surgical history and problem list.   Health Maintenance:   Last pap     Component Value Date/Time   DIAGPAP  11/09/2021 0837    - Negative for intraepithelial lesion or malignancy (NILM)   HPVHIGH Negative 11/09/2021 0837   ADEQPAP  11/09/2021 0837    Satisfactory for evaluation; transformation zone component PRESENT.    High Risk HPV: Positive  Adequacy:  Satisfactory for evaluation, transformation zone component PRESENT  Diagnosis:  Atypical squamous cells of undetermined significance (ASC-US)    Review of Systems:  Pertinent items are noted in HPI. Comprehensive review of systems was otherwise negative.   Objective:  Physical Exam BP (!) 142/87   Pulse 81   Ht 5' 1" (1.549 m)    Wt 178 lb (80.7 kg)   BMI 33.63 kg/m    Physical Exam Vitals and nursing note reviewed.  Constitutional:      Appearance: Normal appearance.  HENT:     Head: Normocephalic and atraumatic.  Pulmonary:     Effort: Pulmonary effort is normal.  Abdominal:     Comments: Enlarged slightly tender uterus, ~16 wk size  Skin:    General: Skin is warm and dry.  Neurological:     General: No focal deficit present.     Mental Status: She is alert.  Psychiatric:        Mood and Affect: Mood normal.        Behavior: Behavior normal.        Thought Content: Thought content normal.        Judgment: Judgment normal.        Assessment & Plan:   1. Fibroids Patient would like to proceed with definitive management of fibroid uterus.  The IUD has been working well to manage bleeding, patient having some bulk symptoms.  Unclear if episode of urinary retention secondary to enlarged multifibroid uterus.  Discussed risk benefits and alternatives of hysterectomy.  Patient is sure that she would like to proceed with definitive management at this time.  Patient examination consistent with enlarged uterus, slightly tender as well.  Patient able to reiterate that she has completed childbearing as well.  Reviewed postoperative expectations including but not limited to recovery, pelvic rest, and transfer needs for surgical day.  Noted that intention is same-day procedure.  Patient desires surgical management with RA-TLH,   BS, cysto.  The risks of surgery were discussed in detail with the patient including but not limited to: bleeding which may require transfusion or reoperation; infection which may require prolonged hospitalization or re-hospitalization and antibiotic therapy; injury to bowel, bladder, ureters and major vessels or other surrounding organs which may lead to other procedures; formation of adhesions; need for additional procedures including laparotomy or subsequent procedures secondary to intraoperative  injury or abnormal pathology; thromboembolic phenomenon; incisional problems and other postoperative or anesthesia complications.  Patient was told that the likelihood that her condition and symptoms will be treated effectively with this surgical management was very high; the postoperative expectations were also discussed in detail. The patient also understands the alternative treatment options which were discussed in full. All questions were answered.  She was told that she will be contacted by our surgical scheduler regarding the time and date of her surgery; routine preoperative instructions will be given to her by the preoperative nursing team.  Printed patient education handouts about the procedure were given to the patient to review at home.   2. Intrauterine contraceptive device threads lost, subsequent encounter Reviewed that recurrent BV/yeast can occur with IUDs, and that typically will resolve after a few months with IUD in place.  Noted that patient may still have issues with recurrent infection after IUD removal/surgery.  Given current location of IUD, likely unable to be removed in office, will be removed with specimen intraoperatively.   Damika Harmon, MD Minimally Invasive Gynecologic [facility], [facility] 

## 2022-08-19 NOTE — Progress Notes (Signed)
Ultrasound complete on 07/09/22. Consult for hysterectomy due to fibroids. Armandina Stammer RN

## 2022-08-21 ENCOUNTER — Encounter: Payer: Self-pay | Admitting: Family Medicine

## 2022-08-21 NOTE — Assessment & Plan Note (Signed)
Continue chlorthalidone 25 mg tablet and spironolactone 50 mg tablet. Labs/Work-up: Order appropriate blood work including chemistries, lipid profile, and hemoglobin A1c. Monitoring: Blood pressure to be followed up in 6 months, or sooner if indicated by lab results.

## 2022-08-23 ENCOUNTER — Telehealth: Payer: Self-pay

## 2022-08-23 NOTE — Telephone Encounter (Signed)
Reached out to patient to see if she was available for surgery on 09/10/22 w/ Dr. Briscoe Deutscher. Received pt's voicemail, left a message requesting a call back at [phone].

## 2022-08-23 NOTE — Telephone Encounter (Signed)
Second attempt to reach patient, no answer. Left a second voicemail providing patient with procedure time, location and pre-op instructions. Provided my phone number in case patient has any questions regarding the 6/4 procedure @7 :30 am/ [facility]. Instructed patient to arrive at 5:30 am. A letter will follow with surgery information.

## 2022-08-26 ENCOUNTER — Telehealth: Payer: Self-pay

## 2022-08-26 NOTE — Telephone Encounter (Signed)
Patient called this morning to confirm she received my voicemail on Friday, 08/23/22. Patient confirmed she's available on 09/10/22 at 7:30 am for surgery and stated she received my surgery letter via mychart along w/ the pre-op instructions that were left on voicemail. Patient also confirmed she knows to arrive at 5:30 am.

## 2022-08-27 ENCOUNTER — Other Ambulatory Visit: Payer: Self-pay | Admitting: Obstetrics and Gynecology

## 2022-08-27 ENCOUNTER — Telehealth: Payer: Self-pay

## 2022-08-27 ENCOUNTER — Encounter (HOSPITAL_BASED_OUTPATIENT_CLINIC_OR_DEPARTMENT_OTHER): Payer: Self-pay | Admitting: Obstetrics and Gynecology

## 2022-08-27 DIAGNOSIS — D219 Benign neoplasm of connective and other soft tissue, unspecified: Secondary | ICD-10-CM

## 2022-08-27 NOTE — Telephone Encounter (Signed)
Called patient alerting her we need her to come sign hysterectomy statement. Left message for patient to return to office. Armandina Stammer RN

## 2022-08-28 ENCOUNTER — Other Ambulatory Visit: Payer: Self-pay

## 2022-08-28 ENCOUNTER — Encounter (HOSPITAL_BASED_OUTPATIENT_CLINIC_OR_DEPARTMENT_OTHER): Payer: Self-pay | Admitting: Obstetrics and Gynecology

## 2022-08-28 ENCOUNTER — Ambulatory Visit (INDEPENDENT_AMBULATORY_CARE_PROVIDER_SITE_OTHER): Payer: BC Managed Care – PPO

## 2022-08-28 DIAGNOSIS — D219 Benign neoplasm of connective and other soft tissue, unspecified: Secondary | ICD-10-CM

## 2022-08-28 NOTE — Addendum Note (Signed)
Addended by: Mikey Bussing on: 08/28/2022 03:54 PM   Modules accepted: Orders

## 2022-08-28 NOTE — Progress Notes (Signed)
Patient presents for CBC labs and to sign a Hysterectomy statement. Hysterectomy statement was signed her in the office and patient was sent to the lab to have CBC labs drawn. Janete Quilling l Vane Yapp

## 2022-08-28 NOTE — Progress Notes (Signed)
Your procedure is scheduled on Tuesday, 09/10/2022.  Report to [facility] AT  5:30 AM.   Call this number if you have problems the morning of surgery  :[phone].   OUR ADDRESS IS [address].  WE ARE LOCATED IN [facility].  PLEASE BRING YOUR INSURANCE CARD AND PHOTO ID DAY OF SURGERY.  ONLY 2 PEOPLE ARE ALLOWED IN  WAITING  ROOM                                      REMEMBER:  DO NOT EAT FOOD, CANDY GUM OR MINTS  AFTER MIDNIGHT THE NIGHT BEFORE YOUR SURGERY . YOU MAY HAVE CLEAR LIQUIDS FROM MIDNIGHT THE NIGHT BEFORE YOUR SURGERY UNTIL  4:30 AM. NO CLEAR LIQUIDS AFTER   4:30 AM DAY OF SURGERY.  YOU MAY  BRUSH YOUR TEETH MORNING OF SURGERY AND RINSE YOUR MOUTH OUT, NO CHEWING GUM CANDY OR MINTS.     CLEAR LIQUID DIET    Allowed      Water                                                                   Coffee and tea, regular and decaf  (NO cream or milk products of any type, may sweeten)                         Carbonated beverages, regular and diet                                    Sports drinks like Gatorade _____________________________________________________________________     TAKE ONLY THESE MEDICATIONS MORNING OF SURGERY: Albuterol inhaler if needed (please bring), Zyrtec, Flonase                                        DO NOT WEAR JEWERLY/  METAL/  PIERCINGS (INCLUDING NO PLASTIC PIERCINGS) DO NOT WEAR LOTIONS, POWDERS, PERFUMES OR NAIL POLISH ON YOUR FINGERNAILS. TOENAIL POLISH IS OK TO WEAR. DO NOT SHAVE FOR 48 HOURS PRIOR TO DAY OF SURGERY.  CONTACTS, GLASSES, OR DENTURES MAY NOT BE WORN TO SURGERY.  REMEMBER: NO SMOKING, VAPING ,  DRUGS OR ALCOHOL FOR 24 HOURS BEFORE YOUR SURGERY.                                    [facility] IS NOT RESPONSIBLE  FOR ANY BELONGINGS.                                                                    Marland Kitchen           [facility] - Preparing for Surgery Before surgery, you can play  an important role.  Because skin is not sterile, your skin needs to be as free of germs as possible.  You can reduce the number of germs on your skin by washing with CHG (chlorahexidine gluconate) soap before surgery.  CHG is an antiseptic cleaner which kills germs and bonds with the skin to continue killing germs even after washing. Please DO NOT use if you have an allergy to CHG or antibacterial soaps.  If your skin becomes reddened/irritated stop using the CHG and inform your nurse when you arrive at Short Stay. Do not shave (including legs and underarms) for at least 48 hours prior to the first CHG shower.  You may shave your face/neck. Please follow these instructions carefully:  1.  Shower with CHG Soap the night before surgery and the  morning of Surgery.  2.  If you choose to wash your hair, wash your hair first as usual with your  normal  shampoo.  3.  After you shampoo, rinse your hair and body thoroughly to remove the  shampoo.                                        4.  Use CHG as you would any other liquid soap.  You can apply chg directly  to the skin and wash , chg soap provided, night before and morning of your surgery.  5.  Apply the CHG Soap to your body ONLY FROM THE NECK DOWN.   Do not use on face/ open                           Wound or open sores. Avoid contact with eyes, ears mouth and genitals (private parts).                       Wash face,  Genitals (private parts) with your normal soap.             6.  Wash thoroughly, paying special attention to the area where your surgery  will be performed.  7.  Thoroughly rinse your body with warm water from the neck down.  8.  DO NOT shower/wash with your normal soap after using and rinsing off  the CHG Soap.             9.  Pat yourself dry with a clean towel.            10.  Wear clean pajamas.            11.  Place clean sheets on your bed the night of your first shower and do not  sleep with pets. Day of Surgery : Do not apply  any lotions/ powders the morning of surgery.  Please wear clean clothes to the hospital/surgery center.  IF YOU HAVE ANY SKIN IRRITATION OR PROBLEMS WITH THE SURGICAL SOAP, PLEASE GET A BAR OF GOLD DIAL SOAP AND SHOWER THE NIGHT BEFORE YOUR SURGERY AND THE MORNING OF YOUR SURGERY. PLEASE LET THE NURSE KNOW MORNING OF YOUR SURGERY IF YOU HAD ANY PROBLEMS WITH THE SURGICAL SOAP.   YOUR SURGEON MAY HAVE REQUESTED EXTENDED RECOVERY TIME AFTER YOUR SURGERY. IT COULD BE A  JUST A FEW HOURS  UP TO AN OVERNIGHT STAY.  YOUR SURGEON SHOULD HAVE DISCUSSED  THIS WITH YOU PRIOR TO YOUR SURGERY. IN THE EVENT YOU NEED TO STAY OVERNIGHT PLEASE REFER TO THE FOLLOWING GUIDELINES. YOU MAY HAVE UP TO 4 VISITORS  MAY VISIT IN THE EXTENDED RECOVERY ROOM UNTIL 800 PM ONLY.  ONE  VISITOR AGE 23 AND OVER MAY SPEND THE NIGHT AND MUST BE IN EXTENDED RECOVERY ROOM NO LATER THAN 800 PM . YOUR DISCHARGE TIME AFTER YOU SPEND THE NIGHT IS 900 AM THE MORNING AFTER YOUR SURGERY. YOU MAY PACK A SMALL OVERNIGHT BAG WITH TOILETRIES FOR YOUR OVERNIGHT STAY IF YOU WISH.  REGARDLESS OF IF YOU STAY OVER NIGHT OR ARE DISCHARGED THE SAME DAY YOU WILL BE REQUIRED TO HAVE A RESPONSIBLE ADULT (18 YRS OLD OR OLDER) STAY WITH YOU FOR AT LEAST THE FIRST 24 HOURS  YOUR PRESCRIPTION MEDICATIONS WILL BE PROVIDED DURING YOUR HOSPITAL STAY.  ________________________________________________________________________                                                        QUESTIONS Jasmine Baird PRE OP NURSE PHONE [phone].

## 2022-08-28 NOTE — Progress Notes (Signed)
Spoke w/ via phone for pre-op interview---Jasmine Baird needs dos---- urine pregnancy              Baird results------09/05/22 Baird appt for cbc, type & screen, bmp, ekg COVID test -----patient states asymptomatic no test needed Arrive at -------0530 on Tuesday, 09/10/2022 NPO after MN NO Solid Food.  Clear liquids from MN until---0430 Med rec completed Medications to take morning of surgery -----Albuterol prn, Zyrtec, Flonase Diabetic medication -----n/a Patient instructed no nail polish to be worn day of surgery Patient instructed to bring photo id and insurance card day of surgery Patient aware to have Driver (ride ) / caregiver    for 24 hours after surgery - mother, Jasmine Baird Patient Special Instructions -----Extended / overnight stay instructions given. Pre-Op special Instructions -----Patient instructed to bring inhaler on day of surgery. Patient verbalized understanding of instructions that were given at this phone interview. Patient denies shortness of breath, chest pain, fever, cough at this phone interview.

## 2022-08-29 LAB — CBC
Hematocrit: 43.7 % (ref 34.0–46.6)
Hemoglobin: 14.7 g/dL (ref 11.1–15.9)
MCH: 27.8 pg (ref 26.6–33.0)
MCHC: 33.6 g/dL (ref 31.5–35.7)
MCV: 83 fL (ref 79–97)
Platelets: 192 10*3/uL (ref 150–450)
RBC: 5.28 x10E6/uL (ref 3.77–5.28)
RDW: 13.9 % (ref 11.7–15.4)
WBC: 10.4 10*3/uL (ref 3.4–10.8)

## 2022-09-05 ENCOUNTER — Encounter: Payer: Self-pay | Admitting: Obstetrics and Gynecology

## 2022-09-05 ENCOUNTER — Encounter (HOSPITAL_COMMUNITY)
Admission: RE | Admit: 2022-09-05 | Discharge: 2022-09-05 | Disposition: A | Discharge status: Home / Self Care | Payer: BC Managed Care – PPO | Source: Ambulatory Visit | Attending: Obstetrics and Gynecology | Admitting: Obstetrics and Gynecology

## 2022-09-05 DIAGNOSIS — Z01818 Encounter for other preprocedural examination: Secondary | ICD-10-CM | POA: Diagnosis not present

## 2022-09-05 DIAGNOSIS — R001 Bradycardia, unspecified: Secondary | ICD-10-CM | POA: Insufficient documentation

## 2022-09-05 DIAGNOSIS — D219 Benign neoplasm of connective and other soft tissue, unspecified: Secondary | ICD-10-CM | POA: Diagnosis not present

## 2022-09-05 LAB — BASIC METABOLIC PANEL
Anion gap: 8 (ref 5–15)
BUN: 15 mg/dL (ref 6–20)
CO2: 26 mmol/L (ref 22–32)
Calcium: 9.1 mg/dL (ref 8.9–10.3)
Chloride: 101 mmol/L (ref 98–111)
Creatinine, Ser: 0.86 mg/dL (ref 0.44–1.00)
GFR, Estimated: >60 mL/min (ref >60)
Glucose, Bld: 97 mg/dL (ref 70–99)
Potassium: 4.1 mmol/L (ref 3.5–5.1)
Sodium: 135 mmol/L (ref 135–145)

## 2022-09-05 LAB — CBC
HCT: 45.3 % (ref 36.0–46.0)
Hemoglobin: 15.1 g/dL — ABNORMAL HIGH (ref 12.0–15.0)
MCH: 28.2 pg (ref 26.0–34.0)
MCHC: 33.3 g/dL (ref 30.0–36.0)
MCV: 84.7 fL (ref 80.0–100.0)
Platelets: 198 10*3/uL (ref 150–400)
RBC: 5.35 MIL/uL — ABNORMAL HIGH (ref 3.87–5.11)
RDW: 13.5 % (ref 11.5–15.5)
WBC: 8.3 10*3/uL (ref 4.0–10.5)
nRBC: 0 % (ref 0.0–0.2)

## 2022-09-05 LAB — TYPE AND SCREEN: Antibody Screen: NEGATIVE

## 2022-09-10 ENCOUNTER — Encounter (HOSPITAL_BASED_OUTPATIENT_CLINIC_OR_DEPARTMENT_OTHER): Payer: Self-pay | Admitting: Obstetrics and Gynecology

## 2022-09-10 ENCOUNTER — Ambulatory Visit (HOSPITAL_BASED_OUTPATIENT_CLINIC_OR_DEPARTMENT_OTHER): Payer: BC Managed Care – PPO | Admitting: Anesthesiology

## 2022-09-10 ENCOUNTER — Encounter (HOSPITAL_BASED_OUTPATIENT_CLINIC_OR_DEPARTMENT_OTHER)
Admission: RE | Disposition: A | Discharge status: Home / Self Care | Payer: Self-pay | Source: Home / Self Care | Attending: Obstetrics and Gynecology

## 2022-09-10 ENCOUNTER — Ambulatory Visit (HOSPITAL_BASED_OUTPATIENT_CLINIC_OR_DEPARTMENT_OTHER)
Admission: RE | Admit: 2022-09-10 | Discharge: 2022-09-10 | Disposition: A | Discharge status: Home / Self Care | Payer: BC Managed Care – PPO | Attending: Obstetrics and Gynecology | Admitting: Obstetrics and Gynecology

## 2022-09-10 DIAGNOSIS — D251 Intramural leiomyoma of uterus: Secondary | ICD-10-CM

## 2022-09-10 DIAGNOSIS — I10 Essential (primary) hypertension: Secondary | ICD-10-CM | POA: Diagnosis not present

## 2022-09-10 DIAGNOSIS — N72 Inflammatory disease of cervix uteri: Secondary | ICD-10-CM | POA: Diagnosis not present

## 2022-09-10 DIAGNOSIS — D250 Submucous leiomyoma of uterus: Secondary | ICD-10-CM

## 2022-09-10 DIAGNOSIS — D219 Benign neoplasm of connective and other soft tissue, unspecified: Secondary | ICD-10-CM | POA: Diagnosis present

## 2022-09-10 DIAGNOSIS — Y762 Prosthetic and other implants, materials and accessory obstetric and gynecological devices associated with adverse incidents: Secondary | ICD-10-CM | POA: Diagnosis not present

## 2022-09-10 DIAGNOSIS — N888 Other specified noninflammatory disorders of cervix uteri: Secondary | ICD-10-CM | POA: Insufficient documentation

## 2022-09-10 DIAGNOSIS — D252 Subserosal leiomyoma of uterus: Secondary | ICD-10-CM | POA: Diagnosis not present

## 2022-09-10 DIAGNOSIS — N83209 Unspecified ovarian cyst, unspecified side: Secondary | ICD-10-CM

## 2022-09-10 DIAGNOSIS — T8332XA Displacement of intrauterine contraceptive device, initial encounter: Secondary | ICD-10-CM | POA: Insufficient documentation

## 2022-09-10 DIAGNOSIS — Z01818 Encounter for other preprocedural examination: Secondary | ICD-10-CM

## 2022-09-10 DIAGNOSIS — N838 Other noninflammatory disorders of ovary, fallopian tube and broad ligament: Secondary | ICD-10-CM | POA: Insufficient documentation

## 2022-09-10 DIAGNOSIS — D259 Leiomyoma of uterus, unspecified: Secondary | ICD-10-CM | POA: Diagnosis not present

## 2022-09-10 DIAGNOSIS — Z30432 Encounter for removal of intrauterine contraceptive device: Secondary | ICD-10-CM | POA: Diagnosis not present

## 2022-09-10 DIAGNOSIS — N946 Dysmenorrhea, unspecified: Secondary | ICD-10-CM | POA: Insufficient documentation

## 2022-09-10 HISTORY — DX: Hypokalemia: E87.6

## 2022-09-10 HISTORY — PX: ROBOTIC ASSISTED TOTAL HYSTERECTOMY WITH BILATERAL SALPINGO OOPHERECTOMY: SHX6086

## 2022-09-10 HISTORY — PX: CYSTOSCOPY: SHX5120

## 2022-09-10 HISTORY — DX: Unspecified asthma, uncomplicated: J45.909

## 2022-09-10 HISTORY — PX: IUD REMOVAL: SHX5392

## 2022-09-10 LAB — POCT PREGNANCY, URINE: Preg Test, Ur: NEGATIVE

## 2022-09-10 LAB — TYPE AND SCREEN: ABO/RH(D): O POS

## 2022-09-10 SURGERY — HYSTERECTOMY, TOTAL, ROBOT-ASSISTED, LAPAROSCOPIC, WITH BILATERAL SALPINGO-OOPHORECTOMY
Anesthesia: General | Site: Vagina

## 2022-09-10 MED ORDER — POVIDONE-IODINE 10 % EX SWAB: 2.0000 | Freq: Once | CUTANEOUS | Status: DC

## 2022-09-10 MED ORDER — POLYETHYLENE GLYCOL 3350 17 G PO PACK: 17.0000 g | PACK | Freq: Every day | ORAL | 1 refills | Status: DC

## 2022-09-10 MED ORDER — BUPIVACAINE HCL (PF) 0.5 % IJ SOLN
INTRAMUSCULAR | Status: DC | PRN
  Administered 2022-09-10: 14 mL

## 2022-09-10 MED ORDER — ACETAMINOPHEN 500 MG PO TABS
ORAL_TABLET | ORAL | Status: AC
  Filled 2022-09-10: qty 2

## 2022-09-10 MED ORDER — HEMOSTATIC AGENTS (NO CHARGE) OPTIME
TOPICAL | Status: DC | PRN
  Administered 2022-09-10: 1 via TOPICAL

## 2022-09-10 MED ORDER — FENTANYL CITRATE (PF) 250 MCG/5ML IJ SOLN
INTRAMUSCULAR | Status: DC | PRN
  Administered 2022-09-10: 100 ug via INTRAVENOUS
  Administered 2022-09-10 (×2): 50 ug via INTRAVENOUS

## 2022-09-10 MED ORDER — CHLORTHALIDONE 25 MG PO TABS: 12.5000 mg | ORAL_TABLET | Freq: Every day | ORAL | Status: DC

## 2022-09-10 MED ORDER — LIDOCAINE HCL (PF) 2 % IJ SOLN
INTRAMUSCULAR | Status: AC
  Filled 2022-09-10: qty 5

## 2022-09-10 MED ORDER — ROCURONIUM BROMIDE 10 MG/ML (PF) SYRINGE
PREFILLED_SYRINGE | INTRAVENOUS | Status: DC | PRN
  Administered 2022-09-10: 50 mg via INTRAVENOUS
  Administered 2022-09-10: 20 mg via INTRAVENOUS
  Administered 2022-09-10: 10 mg via INTRAVENOUS

## 2022-09-10 MED ORDER — KETOROLAC TROMETHAMINE 30 MG/ML IJ SOLN: 30.0000 mg | Freq: Four times a day (QID) | INTRAMUSCULAR | Status: DC

## 2022-09-10 MED ORDER — SIMETHICONE 80 MG PO CHEW: 80.0000 mg | CHEWABLE_TABLET | Freq: Four times a day (QID) | ORAL | 0 refills | Status: DC | PRN

## 2022-09-10 MED ORDER — ROCURONIUM BROMIDE 10 MG/ML (PF) SYRINGE
PREFILLED_SYRINGE | INTRAVENOUS | Status: AC
  Filled 2022-09-10: qty 10

## 2022-09-10 MED ORDER — ONDANSETRON HCL 4 MG PO TABS: 4.0000 mg | ORAL_TABLET | Freq: Four times a day (QID) | ORAL | Status: DC | PRN

## 2022-09-10 MED ORDER — LIDOCAINE 2% (20 MG/ML) 5 ML SYRINGE
INTRAMUSCULAR | Status: DC | PRN
  Administered 2022-09-10: 60 mg via INTRAVENOUS

## 2022-09-10 MED ORDER — CEFAZOLIN SODIUM-DEXTROSE 2-4 GM/100ML-% IV SOLN
2.0000 g | INTRAVENOUS | Status: AC
  Administered 2022-09-10: 2 g via INTRAVENOUS

## 2022-09-10 MED ORDER — PROPOFOL 10 MG/ML IV BOLUS
INTRAVENOUS | Status: AC
  Filled 2022-09-10: qty 20

## 2022-09-10 MED ORDER — POLYETHYLENE GLYCOL 3350 17 G PO PACK: 17.0000 g | PACK | Freq: Every day | ORAL | Status: DC | PRN

## 2022-09-10 MED ORDER — MIDAZOLAM HCL 2 MG/2ML IJ SOLN
INTRAMUSCULAR | Status: AC
  Filled 2022-09-10: qty 2

## 2022-09-10 MED ORDER — KETOROLAC TROMETHAMINE 30 MG/ML IJ SOLN
INTRAMUSCULAR | Status: AC
  Filled 2022-09-10: qty 1

## 2022-09-10 MED ORDER — LACTATED RINGERS IV SOLN: INTRAVENOUS | Status: DC

## 2022-09-10 MED ORDER — ACETAMINOPHEN 500 MG PO TABS
1000.0000 mg | ORAL_TABLET | Freq: Four times a day (QID) | ORAL | Status: DC
  Administered 2022-09-10: 1000 mg via ORAL

## 2022-09-10 MED ORDER — STERILE WATER FOR IRRIGATION IR SOLN
Status: DC | PRN
  Administered 2022-09-10: 500 mL

## 2022-09-10 MED ORDER — ONDANSETRON HCL 4 MG/2ML IJ SOLN
INTRAMUSCULAR | Status: AC
  Filled 2022-09-10: qty 2

## 2022-09-10 MED ORDER — DEXAMETHASONE SODIUM PHOSPHATE 10 MG/ML IJ SOLN
INTRAMUSCULAR | Status: AC
  Filled 2022-09-10: qty 1

## 2022-09-10 MED ORDER — OXYCODONE HCL 5 MG/5ML PO SOLN: 5.0000 mg | Freq: Once | ORAL | Status: DC | PRN

## 2022-09-10 MED ORDER — DIPHENHYDRAMINE HCL 50 MG/ML IJ SOLN
INTRAMUSCULAR | Status: AC
  Filled 2022-09-10: qty 1

## 2022-09-10 MED ORDER — OXYCODONE HCL 5 MG PO TABS: 5.0000 mg | ORAL_TABLET | ORAL | 0 refills | Status: DC | PRN

## 2022-09-10 MED ORDER — KETOROLAC TROMETHAMINE 30 MG/ML IJ SOLN
INTRAMUSCULAR | Status: DC | PRN
  Administered 2022-09-10: 30 mg via INTRAVENOUS

## 2022-09-10 MED ORDER — FENTANYL CITRATE (PF) 100 MCG/2ML IJ SOLN: 25.0000 ug | INTRAMUSCULAR | Status: DC | PRN

## 2022-09-10 MED ORDER — HYDROMORPHONE HCL 1 MG/ML IJ SOLN
INTRAMUSCULAR | Status: DC | PRN
  Administered 2022-09-10: .5 mg via INTRAVENOUS

## 2022-09-10 MED ORDER — MIDAZOLAM HCL 2 MG/2ML IJ SOLN
INTRAMUSCULAR | Status: DC | PRN
  Administered 2022-09-10: 2 mg via INTRAVENOUS

## 2022-09-10 MED ORDER — GABAPENTIN 300 MG PO CAPS
ORAL_CAPSULE | ORAL | Status: AC
  Filled 2022-09-10: qty 1

## 2022-09-10 MED ORDER — SUGAMMADEX SODIUM 200 MG/2ML IV SOLN
INTRAVENOUS | Status: DC | PRN
  Administered 2022-09-10: 160 mg via INTRAVENOUS

## 2022-09-10 MED ORDER — WHITE PETROLATUM EX OINT
TOPICAL_OINTMENT | CUTANEOUS | Status: AC
  Filled 2022-09-10: qty 5

## 2022-09-10 MED ORDER — FLUORESCEIN SODIUM 10 % IV SOLN
INTRAVENOUS | Status: DC | PRN
  Administered 2022-09-10: 10 mg via INTRAVENOUS

## 2022-09-10 MED ORDER — SODIUM CHLORIDE 0.9 % IR SOLN
Status: DC | PRN
  Administered 2022-09-10 (×2): 1000 mL

## 2022-09-10 MED ORDER — OXYCODONE HCL 5 MG PO TABS
5.0000 mg | ORAL_TABLET | ORAL | Status: DC | PRN
  Administered 2022-09-10: 5 mg via ORAL

## 2022-09-10 MED ORDER — DOCUSATE SODIUM 100 MG PO CAPS
ORAL_CAPSULE | ORAL | Status: AC
  Filled 2022-09-10: qty 1

## 2022-09-10 MED ORDER — DEXAMETHASONE SODIUM PHOSPHATE 10 MG/ML IJ SOLN
INTRAMUSCULAR | Status: DC | PRN
  Administered 2022-09-10: 10 mg via INTRAVENOUS

## 2022-09-10 MED ORDER — FENTANYL CITRATE (PF) 250 MCG/5ML IJ SOLN
INTRAMUSCULAR | Status: AC
  Filled 2022-09-10: qty 5

## 2022-09-10 MED ORDER — GABAPENTIN 300 MG PO CAPS
300.0000 mg | ORAL_CAPSULE | ORAL | Status: AC
  Administered 2022-09-10: 300 mg via ORAL

## 2022-09-10 MED ORDER — ONDANSETRON HCL 4 MG/2ML IJ SOLN: 4.0000 mg | Freq: Four times a day (QID) | INTRAMUSCULAR | Status: DC | PRN

## 2022-09-10 MED ORDER — ACETAMINOPHEN 500 MG PO TABS
1000.0000 mg | ORAL_TABLET | ORAL | Status: AC
  Administered 2022-09-10: 1000 mg via ORAL

## 2022-09-10 MED ORDER — HYDROMORPHONE HCL 1 MG/ML IJ SOLN: 0.2000 mg | INTRAMUSCULAR | Status: DC | PRN

## 2022-09-10 MED ORDER — DIPHENHYDRAMINE HCL 50 MG/ML IJ SOLN
INTRAMUSCULAR | Status: DC | PRN
  Administered 2022-09-10: 12.5 mg via INTRAVENOUS

## 2022-09-10 MED ORDER — PROPOFOL 10 MG/ML IV BOLUS
INTRAVENOUS | Status: DC | PRN
  Administered 2022-09-10: 180 mg via INTRAVENOUS

## 2022-09-10 MED ORDER — CEFAZOLIN SODIUM-DEXTROSE 2-4 GM/100ML-% IV SOLN
INTRAVENOUS | Status: AC
  Filled 2022-09-10: qty 100

## 2022-09-10 MED ORDER — ACETAMINOPHEN 500 MG PO TABS: 500.0000 mg | ORAL_TABLET | Freq: Four times a day (QID) | ORAL | 0 refills | Status: DC | PRN

## 2022-09-10 MED ORDER — OXYCODONE HCL 5 MG PO TABS
ORAL_TABLET | ORAL | Status: AC
  Filled 2022-09-10: qty 1

## 2022-09-10 MED ORDER — IBUPROFEN 200 MG PO TABS: 600.0000 mg | ORAL_TABLET | Freq: Four times a day (QID) | ORAL | Status: DC

## 2022-09-10 MED ORDER — OXYCODONE HCL 5 MG PO TABS: 5.0000 mg | ORAL_TABLET | Freq: Once | ORAL | Status: DC | PRN

## 2022-09-10 MED ORDER — HYDROMORPHONE HCL 2 MG/ML IJ SOLN
INTRAMUSCULAR | Status: AC
  Filled 2022-09-10: qty 1

## 2022-09-10 MED ORDER — DOCUSATE SODIUM 100 MG PO CAPS
100.0000 mg | ORAL_CAPSULE | Freq: Two times a day (BID) | ORAL | Status: DC
  Administered 2022-09-10: 100 mg via ORAL

## 2022-09-10 MED ORDER — ONDANSETRON HCL 4 MG/2ML IJ SOLN
INTRAMUSCULAR | Status: DC | PRN
  Administered 2022-09-10: 4 mg via INTRAVENOUS

## 2022-09-10 MED ORDER — IBUPROFEN 800 MG PO TABS: 800.0000 mg | ORAL_TABLET | Freq: Four times a day (QID) | ORAL | 2 refills | Status: DC | PRN

## 2022-09-10 SURGICAL SUPPLY — 65 items
ADH SKN CLS APL DERMABOND .7 (GAUZE/BANDAGES/DRESSINGS) ×3
APL PRP STRL LF DISP 70% ISPRP (MISCELLANEOUS) ×3
APL SRG 38 LTWT LNG FL B (MISCELLANEOUS) ×3
APPLICATOR ARISTA FLEXITIP XL (MISCELLANEOUS) IMPLANT
BLADE SURG 10 STRL SS (BLADE) IMPLANT
CATH FOLEY 3WAY 5CC 16FR (CATHETERS) IMPLANT
CHLORAPREP W/TINT 26 (MISCELLANEOUS) ×3 IMPLANT
COVER BACK TABLE 60X90IN (DRAPES) ×3 IMPLANT
COVER SURGICAL LIGHT HANDLE (MISCELLANEOUS) IMPLANT
COVER TIP SHEARS 8 DVNC (MISCELLANEOUS) ×3 IMPLANT
DEFOGGER SCOPE WARMER CLEARIFY (MISCELLANEOUS) ×3 IMPLANT
DERMABOND ADVANCED .7 DNX12 (GAUZE/BANDAGES/DRESSINGS) ×3 IMPLANT
DRAPE ARM DVNC X/XI (DISPOSABLE) ×12 IMPLANT
DRAPE COLUMN DVNC XI (DISPOSABLE) ×3 IMPLANT
DRAPE SURG IRRIG POUCH 19X23 (DRAPES) ×3 IMPLANT
DRAPE UTILITY XL STRL (DRAPES) ×3 IMPLANT
DRIVER NDL MEGA SUTCUT DVNCXI (INSTRUMENTS) ×3 IMPLANT
DRIVER NDLE MEGA SUTCUT DVNCXI (INSTRUMENTS) ×3 IMPLANT
ELECT REM PT RETURN 9FT ADLT (ELECTROSURGICAL) ×3
ELECTRODE REM PT RTRN 9FT ADLT (ELECTROSURGICAL) ×3 IMPLANT
FORCEPS PROGRASP DVNC XI (FORCEP) ×3 IMPLANT
GAUZE 4X4 16PLY ~~LOC~~+RFID DBL (SPONGE) IMPLANT
GLOVE BIO SURGEON STRL SZ 6 (GLOVE) IMPLANT
GLOVE BIO SURGEON STRL SZ7 (GLOVE) ×9 IMPLANT
GLOVE BIOGEL PI IND STRL 6 (GLOVE) IMPLANT
GLOVE BIOGEL PI IND STRL 6.5 (GLOVE) IMPLANT
GLOVE BIOGEL PI IND STRL 7.0 (GLOVE) ×9 IMPLANT
GLOVE ECLIPSE 6.5 STRL STRAW (GLOVE) IMPLANT
GLOVE SURG SS PI 7.5 STRL IVOR (GLOVE) IMPLANT
GLOVE SURG SYN 6.5 ES PF (GLOVE) ×6 IMPLANT
GLOVE SURG SYN 6.5 PF PI (GLOVE) IMPLANT
GOWN STRL REUS W/ TWL LRG LVL3 (GOWN DISPOSABLE) IMPLANT
GOWN STRL REUS W/ TWL XL LVL3 (GOWN DISPOSABLE) ×9 IMPLANT
GOWN STRL REUS W/TWL LRG LVL3 (GOWN DISPOSABLE) ×6 IMPLANT
GOWN STRL REUS W/TWL XL LVL3 (GOWN DISPOSABLE) ×15 IMPLANT
HEMOSTAT ARISTA ABSORB 3G PWDR (HEMOSTASIS) IMPLANT
HIBICLENS CHG 4% 4OZ (MISCELLANEOUS) IMPLANT
IRRIG SUCT STRYKERFLOW 2 WTIP (MISCELLANEOUS) ×3
IRRIGATION SUCT STRKRFLW 2 WTP (MISCELLANEOUS) ×3 IMPLANT
KIT PINK PAD W/HEAD ARE REST (MISCELLANEOUS) ×3
KIT PINK PAD W/HEAD ARM REST (MISCELLANEOUS) ×3 IMPLANT
KIT TURNOVER CYSTO (KITS) ×3 IMPLANT
LEGGING LITHOTOMY PAIR STRL (DRAPES) ×3 IMPLANT
MANIFOLD NEPTUNE II (INSTRUMENTS) IMPLANT
OBTURATOR OPTICAL STND 8 DVNC (TROCAR) ×3
OBTURATOR OPTICALSTD 8 DVNC (TROCAR) ×3 IMPLANT
OCCLUDER COLPOPNEUMO (BALLOONS) IMPLANT
PACK ROBOT WH (CUSTOM PROCEDURE TRAY) ×3 IMPLANT
PAD OB MATERNITY 4.3X12.25 (PERSONAL CARE ITEMS) ×3 IMPLANT
PAD PREP 24X48 CUFFED NSTRL (MISCELLANEOUS) ×3 IMPLANT
PROTECTOR NERVE ULNAR (MISCELLANEOUS) ×3 IMPLANT
SCISSORS MNPLR CVD DVNC XI (INSTRUMENTS) ×3 IMPLANT
SEAL UNIV 5-12 XI (MISCELLANEOUS) ×12 IMPLANT
SEALER VESSEL EXT DVNC XI (MISCELLANEOUS) IMPLANT
SET IRRIG Y TYPE TUR BLADDER L (SET/KITS/TRAYS/PACK) ×3 IMPLANT
SET TRI-LUMEN FLTR TB AIRSEAL (TUBING) ×3 IMPLANT
SLEEVE SCD COMPRESS KNEE MED (STOCKING) ×3 IMPLANT
SPIKE FLUID TRANSFER (MISCELLANEOUS) ×3 IMPLANT
SUT VIC AB 2-0 SH 27 (SUTURE) ×3
SUT VIC AB 2-0 SH 27XBRD (SUTURE) IMPLANT
SUT VIC AB 4-0 PS2 18 (SUTURE) ×3 IMPLANT
SUT VLOC 180 0 9IN GS21 (SUTURE) ×3 IMPLANT
TOWEL OR 17X24 6PK STRL BLUE (TOWEL DISPOSABLE) ×3 IMPLANT
TRAY FOLEY W/BAG SLVR 14FR LF (SET/KITS/TRAYS/PACK) ×3 IMPLANT
TROCAR PORT AIRSEAL 8X120 (TROCAR) IMPLANT

## 2022-09-10 NOTE — Op Note (Signed)
Jasmine Baird PROCEDURE DATE: 09/10/2022  PREOPERATIVE DIAGNOSIS: dysmenorrhea, fibroids  POSTOPERATIVE DIAGNOSIS: dysmenorrhea, fibroids PROCEDURE:    robotic assisted total hysterectomy, bilateral salpingectomy, repair of vaginal laceration, and cystoscopy SURGEON: Lorriane Shire, MD ASSISTANT:  Dr. Hyacinth Meeker An experienced assistant was required given the standard of surgical care given the complexity of the case.  This assistant was needed for exposure, dissection, suctioning, retraction, instrument exchange, and for overall help during the procedure.  INDICATIONS: 37 y.o. [id number] with large fibroids.  Risks of surgery were discussed with the patient including but not limited to: bleeding which may require transfusion; infection which may require antibiotics; injury to surrounding organs; need for additional procedures including laparotomy;  and other postoperative/anesthesia complications. Written informed consent was obtained.    FINDINGS:  Normal external genitalia, 14 wk size mobile/immobile uterus with  abnormal  contours.  Laparoscopically: normal upper abdominal survey, enlarged lobular multifibroid uterus, normal bilateral fallopian tubes, normal bilateral ovaries, bilateral ureters seen, normal anterior cul de sac, normal posterior cul de sac, adhesions in the left pericolic gutter Cystoscopically: normal bladder wall without apparent injury, bilateral ureteral orifices, fluorescein-stained urine from bilateral ureteral orifices   ANESTHESIA: General INTRAVENOUS FLUIDS:  1000 ml of LR ESTIMATED BLOOD LOSS:  80 ml URINE OUTPUT: 75 ml SPECIMENS: uterus, cervix, bilateral fallopian tubes COMPLICATIONS:  None immediate.  The risks, benefits, and alternatives of surgery were explained, understood, and accepted. Consents were signed. All questions were answered. She was taken to the operating room and general anesthesia was applied without complication. She was placed in the dorsal  lithotomy position and her abdomen and vagina were prepped and draped after she had been carefully positioned on the table. A bimanual exam revealed a 14 week size uterus with abnormal contours. Her adnexa were not enlarged. A Foley catheter was placed and it drained clear throughout the case. A speculum was placed and the cervix visualized.  The cervix was measured and the uterus was sounded to 12 cm. A Rumi uterine manipulator was placed without difficulty.  Gloves were changed and attention was turned to the abdomen. All incisions were infiltrated with local anesthetic. An 8mm incision was made in the LUQ and an optiview airseal trocar was introduced into the abdomen. The Entry was confirmed with low opening intraabdominal pressure and visualization and the abdomen was then insufflated. After good pneumoperitoneum was established, the abdomen was surveyed including the upper abdomen.She was placed in Trendelenburg position and ports were placed in appropriate positions on her abdomen to allow maximum exposure during the robotic case. Specifically, trocars were placed umbilically, in the LLQ, RLQ, and RUQ.  These were all placed under direct laparoscopic visualization after infiltration with local anesthetic. The robot was docked and I proceeded with a robotic portion of the case.  The pelvis was inspected and  the above findings were noted. The right fallopian tube was identified and the mesosalpinx identified  and serially coagulated and divided. Right salpingectomy completed and the fallopian tube passed off. The uteroovarian ligament was then cauterized and divided. The peritoneum overlying the lateral fibroid of the uterus was taken down anteriorly until the round ligament was reached and the round ligament divided. The ureters and the infundibulopelvic ligaments were identified.  The anterior leaf of the broad ligament dissected, cauterized and divided, a bladder flap was created anteriorly and carried  over to the contralateral side. The posterior leaf of the broad ligament was divided and the ureter identified. Attention was turned to the left side and  the left salpingectomy completed and the fallopian tube passed off for specimen. The left uterovarian ligament cauterized and divided and the round ligament cauterized and divided. The anterior leaf of the broad divided and the bladder flap further developed. The posterior leaf was dissected and divided down to the cup. The uterine vessels were identified, skeletonized, cauterized and then cut. The pedicle was lateralized to the cup edge. Attention turned to the right side and the uterine vessels were skeletonized, cauterized, divided and lateralized to the cup edge. The bladder was pushed out of the operative site. The uterus was positioned anteriorly and a posterior colpotomy was made. The colpotomy incision was extended circumferentially, following the blue outline of the Rumi manipulator. All pedicles were hemostatic. Attention turned to the vagina and the manipulator removed after grasping the cervix. The cervix was bivalved and the uterus and its contained fibroids were manually morcellated through the vagina. There was a left lateral vaginal wall laceration noted after specimen was removed. The occluder balloon and vaginal packing were placed. The vaginal cuff was closed with v-loc suture using the robotic system in a running fashion. Excellent hemostasis was noted throughout. The pelvis was irrigated. The intraabdominal pressure was lowered to assess hemostasis. After determining excellent hemostasis, the robot was undocked and Arista applied to the cuff and raw surfaces.  At this point I performed cystoscopy after removal of the occluder balloon, vaginal packing and foley catheter. The cystoscopy revealed fluorescein stained urine ejection from both ureters an intact bladder wall.  The vaginal laceration was repaired with 2-0 vicryl in a running fashion  and was noted to be hemostatic. There were no other laceration noted in the vagina after further inspection. The cuff was palpated to be intact.  The skin from all of the other ports was closed with 3-0 vicryl. The patient was then extubated and taken to recovery in stable condition.   Sponge, lap and needle counts were correct x 2.  Lorriane Shire, MD Minimally Invasive Gynecologic Surgery and Chronic Pelvic Pain Specialist Obstetrics and Gynecology, [facility] for [facility], [facility] 09/10/2022

## 2022-09-10 NOTE — Anesthesia Preprocedure Evaluation (Signed)
Anesthesia Evaluation  Patient identified by MRN, date of birth, ID band Patient awake    Reviewed: Allergy & Precautions, H&P , NPO status , Patient's Chart, lab work & pertinent test results  Airway Mallampati: II   Neck ROM: full    Dental   Pulmonary asthma    breath sounds clear to auscultation       Cardiovascular hypertension,  Rhythm:regular Rate:Normal     Neuro/Psych    GI/Hepatic   Endo/Other    Renal/GU      Musculoskeletal   Abdominal   Peds  Hematology   Anesthesia Other Findings   Reproductive/Obstetrics                             Anesthesia Physical Anesthesia Plan  ASA: 2  Anesthesia Plan: General   Post-op Pain Management:    Induction: Intravenous  PONV Risk Score and Plan: 3 and Ondansetron, Dexamethasone, Midazolam and Treatment may vary due to age or medical condition  Airway Management Planned: Oral ETT  Additional Equipment:   Intra-op Plan:   Post-operative Plan: Extubation in OR  Informed Consent: I have reviewed the patients History and Physical, chart, labs and discussed the procedure including the risks, benefits and alternatives for the proposed anesthesia with the patient or authorized representative who has indicated his/her understanding and acceptance.     Dental advisory given  Plan Discussed with: CRNA, Anesthesiologist and Surgeon  Anesthesia Plan Comments:        Anesthesia Quick Evaluation

## 2022-09-10 NOTE — Brief Op Note (Signed)
09/10/2022  10:18 AM  PATIENT:  Jasmine Baird  37 y.o. female  PRE-OPERATIVE DIAGNOSIS:  Dysmenorrhea, fibroids, pelvic pain  Pelvic pain  POST-OPERATIVE DIAGNOSIS:  Dysmenorrhea, Pelvic pain, fibroids, vaginal laceration  PROCEDURE:  Procedure(s): XI ROBOTIC ASSISTED TOTAL HYSTERECTOMY WITH BILATERAL SALPINGECTOMY (Bilateral) CYSTOSCOPY (N/A) INTRAUTERINE DEVICE (IUD) REMOVAL  SURGEON:  Surgeon(s) and Role:    Lorriane Shire, MD - Primary    * Jerene Bears, MD - Assisting  PHYSICIAN ASSISTANT: n/a  ASSISTANTS: Miller   ANESTHESIA:   local and general  EBL:  80 mL   BLOOD ADMINISTERED:none  DRAINS: none   LOCAL MEDICATIONS USED:  BUPIVICAINE   SPECIMEN:  Source of Specimen:  uterus, cervix, bilateral fallopian tubes  DISPOSITION OF SPECIMEN:  PATHOLOGY  COUNTS:  YES  TOURNIQUET:  * No tourniquets in log *  DICTATION: .Note written in EPIC  PLAN OF CARE: Admit for overnight observation  PATIENT DISPOSITION:  PACU - hemodynamically stable.   Delay start of Pharmacological VTE agent (>24hrs) due to surgical blood loss or risk of bleeding: not applicable

## 2022-09-10 NOTE — Interval H&P Note (Signed)
History and Physical Interval Note:  09/10/2022 7:07 AM  Jasmine Baird  has presented today for surgery, with the diagnosis of Dysmenorrhea  Pelvic pain.  The various methods of treatment have been discussed with the patient and family. After consideration of risks, benefits and other options for treatment, the patient has consented to  Procedure(s): XI ROBOTIC ASSISTED TOTAL HYSTERECTOMY WITH BILATERAL SALPINGECTOMY (Bilateral) CYSTOSCOPY (N/A) INTRAUTERINE DEVICE (IUD) REMOVAL as a surgical intervention.  The patient's history has been reviewed, patient examined, no change in status, stable for surgery.  I have reviewed the patient's chart and labs.  Questions were answered to the patient's satisfaction.     Mccall Lomax

## 2022-09-10 NOTE — Anesthesia Procedure Notes (Signed)
Procedure Name: Intubation Date/Time: 09/10/2022 7:31 AM  Performed by: Dairl Ponder, CRNAPre-anesthesia Checklist: Patient identified, Emergency Drugs available, Suction available and Patient being monitored Patient Re-evaluated:Patient Re-evaluated prior to induction Oxygen Delivery Method: Circle System Utilized Preoxygenation: Pre-oxygenation with 100% oxygen Induction Type: IV induction Ventilation: Mask ventilation without difficulty Laryngoscope Size: Mac and 3 Grade View: Grade I Tube type: Oral Tube size: 7.0 mm Number of attempts: 1 Airway Equipment and Method: Stylet and Oral airway Placement Confirmation: ETT inserted through vocal cords under direct vision, positive ETCO2 and breath sounds checked- equal and bilateral Secured at: 22 cm Tube secured with: Tape Dental Injury: Teeth and Oropharynx as per pre-operative assessment

## 2022-09-10 NOTE — Discharge Instructions (Signed)
Post Op Hysterectomy Instructions °Please read the instructions below. Refer to these instructions for the next few weeks. These instructions provide you with general information on caring for yourself after surgery. Your caregiver may also give you specific instructions. While your treatment has been planned according to the most current medical practices available, unavoidable problems sometimes happen. If you have any problems or questions after you leave, please call your caregiver. ° °HOME CARE INSTRUCTIONS °Healing will take time. You will have discomfort, tenderness, swelling and bruising at the operative site for a couple of weeks. This is normal and will get better as time goes on.  °· Only take over-the-counter or prescription medicines for pain, discomfort or fever as directed by your caregiver.  °· Do not take aspirin. It can cause bleeding.  °· Do not drive when taking pain medication.  °· Follow your caregiver’s advice regarding diet, exercise, lifting, driving and general activities.  °· Resume your usual diet as directed and allowed.  °· Get plenty of rest and sleep.  °· Do not douche, use tampons, or have sexual intercourse until your caregiver gives you permission. .  °· Take your temperature if you feel hot or flushed.  °· You may shower today when you get home.  No tub bath for one week.   °· Do not drink alcohol until you are not taking any narcotic pain medications.  °· Try to have someone home with you for a week or two to help with the household activities.   Be careful over the next two to three weeks with any activities at home that involve lifting, pushing, or pulling.  Listen to your body--if something feels uncomfortable to do, then don't do it. °· Make sure you and your family understands everything about your operation and recovery.  °· Walking up stairs is fine. °· Do not sign any legal documents until you feel normal again.  °· Keep all your follow-up appointments as recommended by  your caregiver.  ° °PLEASE CALL THE OFFICE IF: °· There is swelling, redness or increasing pain in the wound area.  °· Pus is coming from the wound.  °· You notice a bad smell from the wound or surgical dressing.  °· You have pain, redness and swelling from the intravenous site.  °· The wound is breaking open (the edges are not staying together).   °· You develop pain or bleeding when you urinate.  °· You develop abnormal vaginal discharge.  °· You have any type of abnormal reaction or develop an allergy to your medication.  °· You need stronger pain medication for your pain  ° °SEEK IMMEDIATE MEDICAL CARE: °· You develop a temperature of 100.5 or higher.  °· You develop abdominal pain.  °· You develop chest pain.  °· You develop shortness of breath.  °· You pass out.  °· You develop pain, swelling or redness of your leg.  °· You develop heavy vaginal bleeding with or without blood clots.  ° °MEDICATIONS: °· Restart your regular medications BUT wait one week before restarting all vitamins and mineral supplements °· Use Motrin 800mg every 8 hours for the next several days.  This will help you use less Percocet.  Use the Percocet 5/325 1-2 tabs every 4-6 hours as needed for pain. °· You may use an over the counter stool softener like Colace or Dulcolax to help with starting a bowel movement.  Start the day after you go home.  Warm liquids, fluids, and ambulation help   too.  If you have not had a bowel movement in four days, you need to call the office. ° °

## 2022-09-10 NOTE — Transfer of Care (Signed)
Immediate Anesthesia Transfer of Care Note  Patient: Angelyse C Folkes  Procedure(s) Performed: XI ROBOTIC ASSISTED TOTAL HYSTERECTOMY WITH BILATERAL SALPINGECTOMY (Bilateral: Abdomen) CYSTOSCOPY INTRAUTERINE DEVICE (IUD) REMOVAL (Vagina )  Patient Location: PACU  Anesthesia Type:General  Level of Consciousness: drowsy and patient cooperative  Airway & Oxygen Therapy: Patient Spontanous Breathing  Post-op Assessment: Report given to RN and Post -op Vital signs reviewed and stable  Post vital signs: Reviewed and stable  Last Vitals:  Vitals Value Taken Time  BP 117/71 09/10/22 1035  Temp    Pulse 105 09/10/22 1037  Resp 6 09/10/22 1037  SpO2 90 % 09/10/22 1037  Vitals shown include unvalidated device data.  Last Pain:  Vitals:   09/10/22 0556  TempSrc: Oral  PainSc: 0-No pain      Patients Stated Pain Goal: 7 (09/10/22 0556)  Complications: No notable events documented.

## 2022-09-11 ENCOUNTER — Encounter (HOSPITAL_BASED_OUTPATIENT_CLINIC_OR_DEPARTMENT_OTHER): Payer: Self-pay | Admitting: Obstetrics and Gynecology

## 2022-09-11 LAB — SURGICAL PATHOLOGY

## 2022-09-11 NOTE — Anesthesia Postprocedure Evaluation (Signed)
Anesthesia Post Note  Patient: Jasmine Baird  Procedure(s) Performed: XI ROBOTIC ASSISTED TOTAL HYSTERECTOMY WITH BILATERAL SALPINGECTOMY (Bilateral: Abdomen) CYSTOSCOPY INTRAUTERINE DEVICE (IUD) REMOVAL (Vagina )     Patient location during evaluation: PACU Anesthesia Type: General Level of consciousness: awake and alert Pain management: pain level controlled Vital Signs Assessment: post-procedure vital signs reviewed and stable Respiratory status: spontaneous breathing, nonlabored ventilation, respiratory function stable and patient connected to nasal cannula oxygen Cardiovascular status: blood pressure returned to baseline and stable Postop Assessment: no apparent nausea or vomiting Anesthetic complications: no   No notable events documented.  Last Vitals:  Vitals:   09/10/22 1315 09/10/22 1444  BP: 106/73 116/85  Pulse: 90 91  Resp: 15 14  Temp: 36.6 C 36.4 C  SpO2: 99% 98%    Last Pain:  Vitals:   09/10/22 1444  TempSrc:   PainSc: 2                  Kourtland Coopman S

## 2022-09-14 ENCOUNTER — Other Ambulatory Visit: Payer: Self-pay

## 2022-09-23 ENCOUNTER — Telehealth (INDEPENDENT_AMBULATORY_CARE_PROVIDER_SITE_OTHER): Payer: BC Managed Care – PPO | Admitting: Obstetrics and Gynecology

## 2022-09-23 DIAGNOSIS — Z9889 Other specified postprocedural states: Secondary | ICD-10-CM

## 2022-09-23 DIAGNOSIS — Z09 Encounter for follow-up examination after completed treatment for conditions other than malignant neoplasm: Secondary | ICD-10-CM

## 2022-09-23 NOTE — Progress Notes (Signed)
GYNECOLOGY VIRTUAL VISIT ENCOUNTER NOTE  Provider location: Center for [facility] at [facility]   Patient location: Home  I connected with Jasmine Baird on 09/23/22 at  8:55 AM EDT by MyChart Video Encounter and verified that I am speaking with the correct person using two identifiers.   I discussed the limitations, risks, security and privacy concerns of performing an evaluation and management service virtually and the availability of in person appointments. I also discussed with the patient that there may be a patient responsible charge related to this service. The patient expressed understanding and agreed to proceed.   History:  Jasmine Baird is a 37 y.o. [phone] female being evaluated today for postop follow up. Has been doing well. No concerns with her incision. Has been voiding and having BM without issues. No fever or chills. Ambulatory and has been working remote. Not requiring pain medication at this time. Tolerating PO. No vaginal bleeding or abnormal vaginal discharge.      Past Medical History:  Diagnosis Date   Asthma    Follows w/ PCP, Fanny Bien, MD.   Fibroids    History of pre-eclampsia    w/ all pregnancies   Hypertension    Follows w/ PCP, Fanny Bien, MD.   Hypokalemia    mild, treated w/ OTC K+ supplements   Past Surgical History:  Procedure Laterality Date   CYSTOSCOPY N/A 09/10/2022   Procedure: CYSTOSCOPY;  Surgeon: Lorriane Shire, MD;  Location: [facility];  Service: Gynecology;  Laterality: N/A;   IUD REMOVAL  09/10/2022   Procedure: INTRAUTERINE DEVICE (IUD) REMOVAL;  Surgeon: Lorriane Shire, MD;  Location: [facility];  Service: Gynecology;;   ROBOTIC ASSISTED TOTAL HYSTERECTOMY WITH BILATERAL SALPINGO OOPHERECTOMY Bilateral 09/10/2022   Procedure: XI ROBOTIC ASSISTED TOTAL HYSTERECTOMY WITH BILATERAL SALPINGECTOMY;  Surgeon: Lorriane Shire, MD;  Location:  SURGERY  CENTER;  Service: Gynecology;  Laterality: Bilateral;   The following portions of the patient's history were reviewed and updated as appropriate: allergies, current medications, past family history, past medical history, past social history, past surgical history and problem list.    Review of Systems:  Pertinent items noted in HPI and remainder of comprehensive ROS otherwise negative.  Physical Exam:   General:  Alert, oriented and cooperative. Patient appears to be in no acute distress.  Mental Status: Normal mood and affect. Normal behavior. Normal judgment and thought content.   Respiratory: Normal respiratory effort, no problems with respiration noted  Rest of physical exam deferred due to type of encounter  FINAL MICROSCOPIC DIAGNOSIS:   A.  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 Postop check Doing well. Wondering when she can drive to work (~45 minute commute) - discussed doing short drive first prior to long drive. Not currently requiring pain medication which is reassuring. Reviewed ok to return to low intensity, low impact exercise but avoid very strenuous activity as well as heavy lifting. Reviewed benign pathology. Return at 6-8 weeks for in person visit and vaginal cuff check.       I discussed the assessment and treatment plan with the patient. The patient was provided an opportunity to ask questions and all  were answered. The patient agreed with the plan and demonstrated an understanding of the instructions.   The patient was advised to call back or seek an in-person evaluation/go to the ED if the symptoms worsen or if the condition fails to improve as  anticipated.  I provided 5 minutes of face-to-face time during this encounter.   Lorriane Shire, MD Center for [employer], [facility]

## 2022-11-08 ENCOUNTER — Ambulatory Visit (INDEPENDENT_AMBULATORY_CARE_PROVIDER_SITE_OTHER): Payer: BC Managed Care – PPO | Admitting: Obstetrics and Gynecology

## 2022-11-08 ENCOUNTER — Encounter: Payer: Self-pay | Admitting: Obstetrics and Gynecology

## 2022-11-08 VITALS — BP 134/88 | HR 102 | Wt 178.0 lb

## 2022-11-08 DIAGNOSIS — Z09 Encounter for follow-up examination after completed treatment for conditions other than malignant neoplasm: Secondary | ICD-10-CM

## 2022-11-08 NOTE — Progress Notes (Unsigned)
   POSTOPERATIVE VISIT NOTE   Subjective:     Jasmine Baird is a 37 y.o. [id number] who presents to the clinic 8 weeks status post  RA-TLH, BS, cysto  for fibroids. Eating a regular diet without difficulty. Bowel movements are normal. The patient is not having any pain. Incision: doing well  Vaginal bleeding: none Resumed sexual acitivity:   The following portions of the patient's history were reviewed and updated as appropriate: allergies, current medications, past family history, past medical history, past social history, past surgical history, and problem list..   Review of Systems Pertinent items are noted in HPI.    Objective:    BP 134/88   Pulse (!) 102   Wt 178 lb (80.7 kg)   LMP 03/25/2022   BMI 33.63 kg/m  General:  alert, cooperative, and no distress  Abdomen: soft, bowel sounds active, non-tender  Incision:   healing well, no drainage, no erythema, no hernia, no seroma, no swelling, no dehiscence, incision well approximated  Pelvic:   Clinical staff offered to be present for exam: yes  Initials: JH Normal external genitalia, intact vaginal cuff on visual inspection, nontender and intact cuff on single digit palpation     Pathology Results: FINAL MICROSCOPIC DIAGNOSIS:   A.  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 Operative findings again reviewed. Pathology report discussed.   There are no diagnoses linked to this encounter.   Plan:   1. Continue any current medications. 2. Reviewed pathology and continued expectations. Ok to resume exercise.  3. Activity restrictions: none 4. Anticipated return to work:  has already returned . 5.  Follow up as needed   Lorriane Shire, MD Obstetrician & Gynecologist, [facility] for [employer], [facility]

## 2022-12-02 ENCOUNTER — Other Ambulatory Visit: Payer: Self-pay

## 2022-12-02 DIAGNOSIS — B379 Candidiasis, unspecified: Secondary | ICD-10-CM

## 2022-12-02 MED ORDER — FLUCONAZOLE 150 MG PO TABS
150.0000 mg | ORAL_TABLET | Freq: Once | ORAL | 0 refills | Status: AC
Start: 2022-12-02 — End: 2022-12-02

## 2022-12-11 DIAGNOSIS — Z23 Encounter for immunization: Secondary | ICD-10-CM | POA: Diagnosis not present

## 2023-01-21 ENCOUNTER — Telehealth: Payer: BC Managed Care – PPO | Admitting: Physician Assistant

## 2023-01-21 DIAGNOSIS — H66002 Acute suppurative otitis media without spontaneous rupture of ear drum, left ear: Secondary | ICD-10-CM

## 2023-01-21 MED ORDER — AMOXICILLIN-POT CLAVULANATE 875-125 MG PO TABS
1.0000 | ORAL_TABLET | Freq: Two times a day (BID) | ORAL | 0 refills | Status: DC
Start: 2023-01-21 — End: 2023-02-13

## 2023-01-21 NOTE — Progress Notes (Signed)

## 2023-01-23 DIAGNOSIS — Z713 Dietary counseling and surveillance: Secondary | ICD-10-CM | POA: Diagnosis not present

## 2023-02-13 ENCOUNTER — Encounter: Payer: Self-pay | Admitting: Family Medicine

## 2023-02-13 ENCOUNTER — Ambulatory Visit: Payer: BC Managed Care – PPO | Admitting: Family Medicine

## 2023-02-13 VITALS — BP 128/80 | HR 70 | Temp 97.8°F | Wt 168.8 lb

## 2023-02-13 DIAGNOSIS — J4520 Mild intermittent asthma, uncomplicated: Secondary | ICD-10-CM | POA: Diagnosis not present

## 2023-02-13 DIAGNOSIS — I10 Essential (primary) hypertension: Secondary | ICD-10-CM | POA: Diagnosis not present

## 2023-02-13 DIAGNOSIS — E876 Hypokalemia: Secondary | ICD-10-CM

## 2023-02-13 DIAGNOSIS — J302 Other seasonal allergic rhinitis: Secondary | ICD-10-CM

## 2023-02-13 MED ORDER — CHLORTHALIDONE 25 MG PO TABS
25.0000 mg | ORAL_TABLET | Freq: Every day | ORAL | 1 refills | Status: DC
Start: 2023-02-13 — End: 2023-08-21

## 2023-02-13 MED ORDER — MONTELUKAST SODIUM 10 MG PO TABS
10.0000 mg | ORAL_TABLET | Freq: Every day | ORAL | 3 refills | Status: DC
Start: 2023-02-13 — End: 2023-08-25

## 2023-02-13 MED ORDER — FLUTICASONE PROPIONATE 50 MCG/ACT NA SUSP
2.0000 | Freq: Every day | NASAL | 11 refills | Status: DC
Start: 2023-02-13 — End: 2023-08-25

## 2023-02-13 MED ORDER — SPIRONOLACTONE 50 MG PO TABS
50.0000 mg | ORAL_TABLET | Freq: Every day | ORAL | 1 refills | Status: DC
Start: 2023-02-13 — End: 2023-08-21

## 2023-02-13 MED ORDER — ALBUTEROL SULFATE HFA 108 (90 BASE) MCG/ACT IN AERS
1.0000 | INHALATION_SPRAY | Freq: Four times a day (QID) | RESPIRATORY_TRACT | 11 refills | Status: AC | PRN
Start: 2023-02-13 — End: ?

## 2023-02-13 NOTE — Assessment & Plan Note (Signed)
Stable.  Continue chlorthalidone 25 mg tablet and spironolactone 50 mg tablet. Monitor blood pressure

## 2023-02-13 NOTE — Assessment & Plan Note (Signed)
Increased use of albuterol noted, but no current wheezing.  Refill prescriptions for montelukast, albuterol inhaler, and fluticasone nasal spray.

## 2023-02-13 NOTE — Progress Notes (Signed)
Assessment/Plan:   Problem List Items Addressed This Visit       Cardiovascular and Mediastinum   Chronic hypertension    Stable.  Continue chlorthalidone 25 mg tablet and spironolactone 50 mg tablet. Monitor blood pressure       Relevant Medications   chlorthalidone (HYGROTON) 25 MG tablet   spironolactone (ALDACTONE) 50 MG tablet     Respiratory   Asthma    Increased use of albuterol noted, but no current wheezing.  Refill prescriptions for montelukast, albuterol inhaler, and fluticasone nasal spray.      Relevant Medications   montelukast (SINGULAIR) 10 MG tablet   albuterol (VENTOLIN HFA) 108 (90 Base) MCG/ACT inhaler     Other   Seasonal allergies - Primary   Relevant Medications   montelukast (SINGULAIR) 10 MG tablet   fluticasone (FLONASE) 50 MCG/ACT nasal spray   Hypokalemia   Relevant Medications   spironolactone (ALDACTONE) 50 MG tablet    Medications Discontinued During This Encounter  Medication Reason   amoxicillin-clavulanate (AUGMENTIN) 875-125 MG tablet    albuterol (VENTOLIN HFA) 108 (90 Base) MCG/ACT inhaler Reorder   montelukast (SINGULAIR) 10 MG tablet Reorder   fluticasone (FLONASE) 50 MCG/ACT nasal spray Reorder   chlorthalidone (HYGROTON) 25 MG tablet Reorder   spironolactone (ALDACTONE) 50 MG tablet Reorder    Return in about 6 months (around 08/13/2023) for BP, physical (fasting labs).    Subjective:   Encounter date: 02/13/2023  Jasmine Baird is a 37 y.o. female who has Chronic hypertension; Asthma; Seasonal allergies; Fibroids; Hypokalemia; Encounter for preoperative assessment; Class 1 obesity with serious comorbidity in adult; Fibroid; Intramural, submucous, and subserous leiomyoma of uterus; and Encounter for IUD removal on their problem list..   She  has a past medical history of Asthma, Fibroids, History of pre-eclampsia, Hypertension, and Hypokalemia..   Chief Complaint: Follow-up for blood pressure and  asthma  Subjective:   Jasmine Baird reports that her recent hysterectomy went well with minimal blood loss and no transfusions required. She has had no recent chest pain, shortness of breath, leg swelling, blurry vision, or palpitations.   She mentions having had a sinus infection a couple of weeks ago and has throat discomfort currently.  She reports mild wheezing and shortness of breath and is using her albuterol inhaler more frequently over the last couple of weeks (every other day, typically in the mornings). No nocturnal symptoms reported.  Hypertension. Stable on current meds. No side effects.      02/13/2023   11:17 AM 08/30/2021    8:03 AM 08/17/2021    9:38 AM 06/29/2015   10:01 AM 04/20/2015    9:45 AM  Depression screen PHQ 2/9  Decreased Interest 0 0 0 0 0  Down, Depressed, Hopeless 0 0 0 0 0  PHQ - 2 Score 0 0 0 0 0  Altered sleeping 0      Tired, decreased energy 0      Change in appetite 0      Feeling bad or failure about yourself  0      Trouble concentrating 0      Moving slowly or fidgety/restless 0      Suicidal thoughts 0      PHQ-9 Score 0      Difficult doing work/chores Not difficult at all          Past Surgical History:  Procedure Laterality Date   CYSTOSCOPY N/A 09/10/2022   Procedure: CYSTOSCOPY;  Surgeon: Lorriane Shire, MD;  Location: [facility];  Service: Gynecology;  Laterality: N/A;   IUD REMOVAL  09/10/2022   Procedure: INTRAUTERINE DEVICE (IUD) REMOVAL;  Surgeon: Lorriane Shire, MD;  Location: [facility];  Service: Gynecology;;   ROBOTIC ASSISTED TOTAL HYSTERECTOMY WITH BILATERAL SALPINGO OOPHERECTOMY Bilateral 09/10/2022   Procedure: XI ROBOTIC ASSISTED TOTAL HYSTERECTOMY WITH BILATERAL SALPINGECTOMY;  Surgeon: Lorriane Shire, MD;  Location: [facility];  Service: Gynecology;  Laterality: Bilateral;    Outpatient Medications Prior to Visit  Medication Sig Dispense Refill   cetirizine (ZYRTEC)  10 MG tablet Take 1 tablet (10 mg total) by mouth daily. 90 tablet 3   potassium chloride (KLOR-CON) 10 MEQ tablet Take 10 mEq by mouth daily.     albuterol (VENTOLIN HFA) 108 (90 Base) MCG/ACT inhaler Inhale 1-2 puffs into the lungs every 6 (six) hours as needed for wheezing or shortness of breath. 1 each 0   fluticasone (FLONASE) 50 MCG/ACT nasal spray Place 2 sprays into both nostrils daily. 1 g 11   montelukast (SINGULAIR) 10 MG tablet Take 1 tablet (10 mg total) by mouth at bedtime. 30 tablet 3   amoxicillin-clavulanate (AUGMENTIN) 875-125 MG tablet Take 1 tablet by mouth 2 (two) times daily. (Patient not taking: Reported on 02/13/2023) 20 tablet 0   chlorthalidone (HYGROTON) 25 MG tablet Take 1 tablet (25 mg total) by mouth daily. 90 tablet 1   spironolactone (ALDACTONE) 50 MG tablet Take 1 tablet (50 mg total) by mouth daily. 90 tablet 1   No facility-administered medications prior to visit.    Family History  Problem Relation Age of Onset   Hypertension Mother    Cancer Father    Hypertension Father    Hyperlipidemia Father    Asthma Daughter    Allergies Daughter    Asthma Son    Heart disease Maternal Uncle    Hyperlipidemia Maternal Grandmother    Diabetes Maternal Grandmother    Hypertension Maternal Grandmother    Cancer Maternal Grandfather 33       PANCREATIC    Social History   Socioeconomic History   Marital status: Divorced    Spouse name: Alfonzo Lamm   Number of children: 2   Years of education: Not on file   Highest education level: Associate degree: occupational, Scientist, product/process development, or vocational program  Occupational History   Occupation: Paralegal  Tobacco Use   Smoking status: Never    Passive exposure: Never   Smokeless tobacco: Never  Vaping Use   Vaping status: Never Used  Substance and Sexual Activity   Alcohol use: Yes    Comment: very rare   Drug use: No   Sexual activity: Yes    Birth control/protection: I.U.D.  Other Topics Concern   Not  on file  Social History Narrative   1. Son, [name], 30 yo,    2. Daughter, [name], 31 yo    Social Determinants of Health   Financial Resource Strain: Low Risk  (08/15/2022)   Overall Financial Resource Strain (CARDIA)    Difficulty of Paying Living Expenses: Not very hard  Food Insecurity: No Food Insecurity (08/15/2022)   Hunger Vital Sign    Worried About Running Out of Food in the Last Year: Never true    Ran Out of Food in the Last Year: Never true  Transportation Needs: No Transportation Needs (08/15/2022)   PRAPARE - Administrator, Civil Service (Medical): No    Lack of Transportation (Non-Medical): No  Physical Activity: Insufficiently  Active (08/15/2022)   Exercise Vital Sign    Days of Exercise per Week: 2 days    Minutes of Exercise per Session: 50 min  Stress: No Stress Concern Present (08/15/2022)   [employer] of Occupational Health - Occupational Stress Questionnaire    Feeling of Stress : Only a little  Social Connections: Unknown (08/15/2022)   Social Connection and Isolation Panel [NHANES]    Frequency of Communication with Friends and Family: More than three times a week    Frequency of Social Gatherings with Friends and Family: More than three times a week    Attends Religious Services: Patient declined    Database administrator or Organizations: No    Attends Engineer, structural: Not on file    Marital Status: Separated  Intimate Partner Violence: Not on file                                                                                                  Objective:  Physical Exam: BP 128/80 (BP Location: Left Arm, Patient Position: Sitting, Cuff Size: Large)   Pulse 70   Temp 97.8 F (36.6 C) (Temporal)   Wt 168 lb 12.8 oz (76.6 kg)   LMP 03/25/2022   SpO2 99%   BMI 31.89 kg/m     Physical Exam Constitutional:      General: She is not in acute distress.    Appearance: Normal appearance. She is not ill-appearing or  toxic-appearing.  HENT:     Head: Normocephalic and atraumatic.     Nose: Nose normal. No congestion.  Eyes:     General: No scleral icterus.    Extraocular Movements: Extraocular movements intact.  Cardiovascular:     Rate and Rhythm: Normal rate and regular rhythm.     Pulses: Normal pulses.     Heart sounds: Normal heart sounds.  Pulmonary:     Effort: Pulmonary effort is normal. No respiratory distress.     Breath sounds: Normal breath sounds.  Abdominal:     General: Abdomen is flat. Bowel sounds are normal.     Palpations: Abdomen is soft.  Musculoskeletal:        General: Normal range of motion.  Lymphadenopathy:     Cervical: No cervical adenopathy.  Skin:    General: Skin is warm and dry.     Findings: No rash.  Neurological:     General: No focal deficit present.     Mental Status: She is alert and oriented to person, place, and time. Mental status is at baseline.  Psychiatric:        Mood and Affect: Mood normal.        Behavior: Behavior normal.        Thought Content: Thought content normal.        Judgment: Judgment normal.     No results found.  No results found for this or any previous visit (from the past 2160 hour(s)).      Garner Nash, MD, MS

## 2023-08-21 ENCOUNTER — Ambulatory Visit (INDEPENDENT_AMBULATORY_CARE_PROVIDER_SITE_OTHER): Admitting: Family Medicine

## 2023-08-21 VITALS — BP 130/98 | HR 81 | Temp 97.1°F | Resp 18 | Wt 158.6 lb

## 2023-08-21 DIAGNOSIS — E876 Hypokalemia: Secondary | ICD-10-CM

## 2023-08-21 DIAGNOSIS — I10 Essential (primary) hypertension: Secondary | ICD-10-CM | POA: Diagnosis not present

## 2023-08-21 DIAGNOSIS — E559 Vitamin D deficiency, unspecified: Secondary | ICD-10-CM

## 2023-08-21 LAB — MICROALBUMIN / CREATININE URINE RATIO
Creatinine,U: 98.3 mg/dL
Microalb Creat Ratio: UNDETERMINED mg/g (ref 0.0–30.0)
Microalb, Ur: <0.7 mg/dL

## 2023-08-21 LAB — CBC WITH DIFFERENTIAL/PLATELET
Basophils Absolute: 0 10*3/uL (ref 0.0–0.1)
Basophils Relative: 0.5 % (ref 0.0–3.0)
Eosinophils Absolute: 0.3 10*3/uL (ref 0.0–0.7)
Eosinophils Relative: 2.8 % (ref 0.0–5.0)
HCT: 44.3 % (ref 36.0–46.0)
Hemoglobin: 15 g/dL (ref 12.0–15.0)
Lymphocytes Relative: 23.6 % (ref 12.0–46.0)
Lymphs Abs: 2.1 10*3/uL (ref 0.7–4.0)
MCHC: 33.9 g/dL (ref 30.0–36.0)
MCV: 85 fl (ref 78.0–100.0)
Monocytes Absolute: 0.5 10*3/uL (ref 0.1–1.0)
Monocytes Relative: 5.8 % (ref 3.0–12.0)
Neutro Abs: 6.1 10*3/uL (ref 1.4–7.7)
Neutrophils Relative %: 67.3 % (ref 43.0–77.0)
Platelets: 218 10*3/uL (ref 150.0–400.0)
RBC: 5.21 Mil/uL — ABNORMAL HIGH (ref 3.87–5.11)
RDW: 13.2 % (ref 11.5–15.5)
WBC: 9.1 10*3/uL (ref 4.0–10.5)

## 2023-08-21 LAB — COMPREHENSIVE METABOLIC PANEL WITH GFR
ALT: 12 U/L (ref 0–35)
AST: 19 U/L (ref 0–37)
Albumin: 4.8 g/dL (ref 3.5–5.2)
Alkaline Phosphatase: 78 U/L (ref 39–117)
BUN: 16 mg/dL (ref 6–23)
CO2: 28 meq/L (ref 19–32)
Calcium: 9.9 mg/dL (ref 8.4–10.5)
Chloride: 97 meq/L (ref 96–112)
Creatinine, Ser: 0.89 mg/dL (ref 0.40–1.20)
GFR: 81.85 mL/min (ref >60.00)
Glucose, Bld: 92 mg/dL (ref 70–99)
Potassium: 3.8 meq/L (ref 3.5–5.1)
Sodium: 135 meq/L (ref 135–145)
Total Bilirubin: 0.6 mg/dL (ref 0.2–1.2)
Total Protein: 7.8 g/dL (ref 6.0–8.3)

## 2023-08-21 LAB — LIPID PANEL
Cholesterol: 146 mg/dL (ref 0–200)
HDL: 52 mg/dL (ref >39.00)
LDL Cholesterol: 69 mg/dL (ref 0–99)
NonHDL: 94
Total CHOL/HDL Ratio: 3
Triglycerides: 123 mg/dL (ref 0.0–149.0)
VLDL: 24.6 mg/dL (ref 0.0–40.0)

## 2023-08-21 LAB — HEMOGLOBIN A1C: Hgb A1c MFr Bld: 5.5 % (ref 4.6–6.5)

## 2023-08-21 MED ORDER — VALSARTAN-HYDROCHLOROTHIAZIDE 160-25 MG PO TABS
1.0000 | ORAL_TABLET | Freq: Every day | ORAL | 3 refills | Status: DC
Start: 2023-08-21 — End: 2023-09-04

## 2023-08-21 MED ORDER — SPIRONOLACTONE 50 MG PO TABS
50.0000 mg | ORAL_TABLET | Freq: Every day | ORAL | 3 refills | Status: DC
Start: 2023-08-21 — End: 2023-09-04

## 2023-08-21 NOTE — Patient Instructions (Addendum)
  VISIT SUMMARY: Today, we discussed your blood pressure management, your recovery from hysterectomy, and your vitamin D levels. We reviewed your current medications and made some adjustments to better control your blood pressure. We also talked about general lifestyle changes to improve your overall health.  YOUR PLAN: -HYPERTENSION: Hypertension means high blood pressure. Your blood pressure is not well-controlled with your current medications. We will discontinue chlorthalidone  and your potassium supplement.  Please start taking the combination therapy of valsartan 160 mg and hydrochlorothiazide  25 mg. You should continue taking spironolactone  50 mg. We will check your kidney function and electrolytes now and again in two weeks. Please monitor your blood pressure at home and return in four weeks for a follow-up.  -VITAMIN D DEFICIENCY: Vitamin D deficiency means you have low levels of vitamin D, which is important for bone health. We will recheck your vitamin D levels to see if you need supplements.  -GENERAL HEALTH MAINTENANCE: We discussed lifestyle changes to improve your cardiovascular health. This includes following a low salt diet like the DASH diet, eating more fruits and vegetables, and getting at least 150 minutes of moderate or higher level cardiovascular exercise each week.  INSTRUCTIONS: Please get your baseline lab work done to check your kidney function and electrolytes. Recheck these labs in two weeks. Monitor your blood pressure at home and return for a follow-up appointment in four weeks. We will also recheck your vitamin D levels.                             Contains text generated by [employer].

## 2023-08-21 NOTE — Progress Notes (Signed)
 Assessment & Plan   Assessment/Plan:     Assessment & Plan Hypertension Hypertension is not well-controlled with current medication regimen. Home blood pressure readings have been elevated, with recent readings of 142/85 mmHg and in-office readings of 157/104 mmHg and 130/?. Current medications include chlorthalidone  25 mg and spironolactone  50 mg.  She also takes potassium supplementation due to chronic hypokalemia on this combination likely due to the effects of the chlorthalidone .  Consideration of adding an angiotensin receptor blocker (ARB) to improve blood pressure control. Valsartan is suggested as a potential addition. Discussed the benefits of reducing pill burden by using combination therapy with valsartan and hydrochlorothiazide .  Need to monitor emphasized the importance of monitoring kidney function and electrolytes including potassium due to potential changes with the new medication regimen. - Discontinue chlorthalidone  25 mg daily. -Discontinue potassium 10 mill equivalents daily - Initiate valsartan 160 mg with hydrochlorothiazide  25 mg combination therapy. - Continue spironolactone  50 mg. - Order baseline lab work including kidney function and electrolytes, recheck lipid panel, screen for diabetes with A1c and fasting blood sugar, assess potential thyroid impact on hypertension with thyroid panel panel, assess renal function with urinalysis and microalbumin creatinine ratio, assess liver function with LFTs for metabolic panel - Recheck kidney function and electrolytes in two weeks with BMP. - Instruct her to monitor blood pressure at home and return in four weeks for follow-up.  Uterine fibroids and hysterectomy Uterine fibroids managed with hysterectomy, with ovaries preserved to prevent menopause. No postoperative complications reported.  Vitamin D deficiency Vitamin D deficiency status is unknown. - Recheck vitamin D levels.  General Health Maintenance Lifestyle  modifications for cardiovascular health discussed. - Recommend low salt diet, such as the DASH diet. - Encourage consumption of fruits and vegetables. - Advise 150 minutes per week of moderate or higher level cardiovascular exercise.      Medications Discontinued During This Encounter  Medication Reason   chlorthalidone  (HYGROTON ) 25 MG tablet    spironolactone  (ALDACTONE ) 50 MG tablet Reorder   potassium chloride  (KLOR-CON ) 10 MEQ tablet     Return in about 1 month (around 09/21/2023) for physical, BP, return in 2 weeks from 08/31/2023 to repeat metabolic panel .        Subjective:   Encounter date: 08/21/2023  Jasmine Baird is a 38 y.o. female who has Chronic hypertension; Asthma; Seasonal allergies; Fibroids; Hypokalemia; Encounter for preoperative assessment; Class 1 obesity with serious comorbidity in adult; Intramural, submucous, and subserous leiomyoma of uterus; and Encounter for IUD removal on their problem list..   She  has a past medical history of Asthma, Fibroids, History of pre-eclampsia, Hypertension, and Hypokalemia..   She presents with chief complaint of Medication Refill (RX refill on all medications //HM due- Prevnar 20 vaccine ( Pt declined today) ) .   Discussed the use of AI scribe software for clinical note transcription with the patient, who gave verbal consent to proceed.  History of Present Illness Jasmine Baird is a 38 year old female with hypertension who presents for blood pressure management.  She has been monitoring her blood pressure at home, with recent readings around 142/85 mmHg, although she has not checked it in several weeks. She is currently on chlorthalidone  25 mg and spironolactone  50 mg, and takes potassium supplements as needed. No chest pain, shortness of breath, urinary problems, palpitations, or leg swelling.  She underwent a hysterectomy for fibroids, with her ovaries left intact, and reports no complications such as  postoperative bleeding. She states  she has recovered well since the surgery.  She has a history of vitamin D deficiency, though she is unsure of her current status.     ROS  Past Surgical History:  Procedure Laterality Date   CYSTOSCOPY N/A 09/10/2022   Procedure: CYSTOSCOPY;  Surgeon: Kiki Pelton, MD;  Location: [facility];  Service: Gynecology;  Laterality: N/A;   IUD REMOVAL  09/10/2022   Procedure: INTRAUTERINE DEVICE (IUD) REMOVAL;  Surgeon: Kiki Pelton, MD;  Location: [facility];  Service: Gynecology;;   ROBOTIC ASSISTED TOTAL HYSTERECTOMY WITH BILATERAL SALPINGO OOPHERECTOMY Bilateral 09/10/2022   Procedure: XI ROBOTIC ASSISTED TOTAL HYSTERECTOMY WITH BILATERAL SALPINGECTOMY;  Surgeon: Kiki Pelton, MD;  Location: [facility];  Service: Gynecology;  Laterality: Bilateral;    Outpatient Medications Prior to Visit  Medication Sig Dispense Refill   albuterol  (VENTOLIN  HFA) 108 (90 Base) MCG/ACT inhaler Inhale 1-2 puffs into the lungs every 6 (six) hours as needed for wheezing or shortness of breath. 1 each 11   fluticasone  (FLONASE ) 50 MCG/ACT nasal spray Place 2 sprays into both nostrils daily. 1 g 11   montelukast  (SINGULAIR ) 10 MG tablet Take 1 tablet (10 mg total) by mouth at bedtime. 90 tablet 3   potassium chloride  (KLOR-CON ) 10 MEQ tablet Take 10 mEq by mouth daily.     cetirizine  (ZYRTEC ) 10 MG tablet Take 1 tablet (10 mg total) by mouth daily. 90 tablet 3   chlorthalidone  (HYGROTON ) 25 MG tablet Take 1 tablet (25 mg total) by mouth daily. 90 tablet 1   spironolactone  (ALDACTONE ) 50 MG tablet Take 1 tablet (50 mg total) by mouth daily. 90 tablet 1   No facility-administered medications prior to visit.    Family History  Problem Relation Age of Onset   Hypertension Mother    Cancer Father    Hypertension Father    Hyperlipidemia Father    Asthma Daughter    Allergies Daughter    Asthma Son    Heart  disease Maternal Uncle    Hyperlipidemia Maternal Grandmother    Diabetes Maternal Grandmother    Hypertension Maternal Grandmother    Cancer Maternal Grandfather 75       PANCREATIC    Social History   Socioeconomic History   Marital status: Divorced    Spouse name: Alfonzo Salsgiver   Number of children: 2   Years of education: Not on file   Highest education level: Associate degree: occupational, Scientist, product/process development, or vocational program  Occupational History   Occupation: Paralegal  Tobacco Use   Smoking status: Never    Passive exposure: Never   Smokeless tobacco: Never  Vaping Use   Vaping status: Never Used  Substance and Sexual Activity   Alcohol use: Yes    Comment: very rare   Drug use: No   Sexual activity: Yes    Birth control/protection: I.U.D.  Other Topics Concern   Not on file  Social History Narrative   1. Son, [name], 38 yo,    2. Daughter, Rayma Calandra, 82 yo    Social Drivers of Corporate investment banker Strain: Low Risk  (08/21/2023)   Overall Financial Resource Strain (CARDIA)    Difficulty of Paying Living Expenses: Not very hard  Food Insecurity: No Food Insecurity (08/21/2023)   Hunger Vital Sign    Worried About Running Out of Food in the Last Year: Never true    Ran Out of Food in the Last Year: Never true  Transportation Needs: No Transportation Needs (  08/21/2023)   PRAPARE - Administrator, Civil Service (Medical): No    Lack of Transportation (Non-Medical): No  Physical Activity: Insufficiently Active (08/21/2023)   Exercise Vital Sign    Days of Exercise per Week: 2 days    Minutes of Exercise per Session: 50 min  Stress: No Stress Concern Present (08/21/2023)   [employer] of Occupational Health - Occupational Stress Questionnaire    Feeling of Stress : Not at all  Social Connections: Socially Isolated (08/21/2023)   Social Connection and Isolation Panel [NHANES]    Frequency of Communication with Friends and Family: More than  three times a week    Frequency of Social Gatherings with Friends and Family: Once a week    Attends Religious Services: Never    Database administrator or Organizations: No    Attends Engineer, structural: Not on file    Marital Status: Divorced  Catering manager Violence: Not on file                                                                                                  Objective:  Physical Exam: BP (!) 130/98 (BP Location: Right Arm, Patient Position: Sitting, Cuff Size: Large) Comment: recheck done manual  Pulse 81   Temp (!) 97.1 F (36.2 C) (Temporal)   Resp 18   Wt 158 lb 9.6 oz (71.9 kg)   LMP 03/25/2022   SpO2 99%   BMI 29.97 kg/m    Physical Exam VITALS: BP- 157/104 GENERAL: Alert, cooperative, well developed, no acute distress HEENT: Normocephalic, normal oropharynx, moist mucous membranes CHEST: Clear to auscultation bilaterally, no wheezes, rhonchi, or crackles, lungs normal CARDIOVASCULAR: Normal heart rate and rhythm, S1 and S2 normal without murmurs, heart normal ABDOMEN: Soft, non-tender, non-distended, without organomegaly, normal bowel sounds EXTREMITIES: No cyanosis or edema NEUROLOGICAL: Cranial nerves grossly intact, moves all extremities without gross motor or sensory deficit   Physical Exam  No results found.  No results found for this or any previous visit (from the past 2160 hours).      Carnell Christian, MD, MS

## 2023-08-22 ENCOUNTER — Encounter: Payer: Self-pay | Admitting: Family Medicine

## 2023-08-25 ENCOUNTER — Other Ambulatory Visit: Payer: Self-pay

## 2023-08-25 DIAGNOSIS — J302 Other seasonal allergic rhinitis: Secondary | ICD-10-CM

## 2023-08-25 DIAGNOSIS — J4520 Mild intermittent asthma, uncomplicated: Secondary | ICD-10-CM

## 2023-08-25 MED ORDER — CETIRIZINE HCL 10 MG PO TABS: 10.0000 mg | ORAL_TABLET | Freq: Every day | ORAL | 3 refills | Status: AC

## 2023-08-25 MED ORDER — FLUTICASONE PROPIONATE 50 MCG/ACT NA SUSP
2.0000 | Freq: Every day | NASAL | 11 refills | Status: AC
Start: 2023-08-25 — End: 2024-08-19

## 2023-08-25 MED ORDER — MONTELUKAST SODIUM 10 MG PO TABS
10.0000 mg | ORAL_TABLET | Freq: Every day | ORAL | 3 refills | Status: AC
Start: 2023-08-25 — End: 2024-08-19

## 2023-08-26 ENCOUNTER — Ambulatory Visit: Payer: Self-pay | Admitting: Family Medicine

## 2023-08-26 LAB — URINALYSIS W MICROSCOPIC + REFLEX CULTURE
Bacteria, UA: NONE SEEN /HPF
Bilirubin Urine: NEGATIVE
Glucose, UA: NEGATIVE
Hgb urine dipstick: NEGATIVE
Hyaline Cast: NONE SEEN /LPF
Ketones, ur: NEGATIVE
Nitrites, Initial: NEGATIVE
Protein, ur: NEGATIVE
RBC / HPF: NONE SEEN /HPF (ref 0–2)
Specific Gravity, Urine: 1.013 (ref 1.001–1.035)
WBC, UA: NONE SEEN /HPF (ref 0–5)
pH: 8 (ref 5.0–8.0)

## 2023-08-26 LAB — URINE CULTURE
MICRO NUMBER:: 16464046
SPECIMEN QUALITY:: ADEQUATE

## 2023-08-26 LAB — VITAMIN D 1,25 DIHYDROXY
Vitamin D 1, 25 (OH)2 Total: 19 pg/mL (ref 18–72)
Vitamin D2 1, 25 (OH)2: <8 pg/mL
Vitamin D3 1, 25 (OH)2: 19 pg/mL

## 2023-08-26 LAB — THYROID PANEL WITH TSH
Free Thyroxine Index: 2.5 (ref 1.4–3.8)
T3 Uptake: 26 % (ref 22–35)
T4, Total: 9.7 ug/dL (ref 5.1–11.9)
TSH: 0.92 m[IU]/L

## 2023-08-26 LAB — CULTURE INDICATED

## 2023-09-04 ENCOUNTER — Ambulatory Visit (INDEPENDENT_AMBULATORY_CARE_PROVIDER_SITE_OTHER): Admitting: Family Medicine

## 2023-09-04 DIAGNOSIS — E876 Hypokalemia: Secondary | ICD-10-CM

## 2023-09-04 DIAGNOSIS — I10 Essential (primary) hypertension: Secondary | ICD-10-CM

## 2023-09-04 MED ORDER — CHLORTHALIDONE 25 MG PO TABS
50.0000 mg | ORAL_TABLET | Freq: Every day | ORAL | 3 refills | Status: DC
Start: 2023-09-04 — End: 2023-11-21

## 2023-09-04 MED ORDER — POTASSIUM CHLORIDE CRYS ER 10 MEQ PO TBCR
10.0000 meq | EXTENDED_RELEASE_TABLET | Freq: Every day | ORAL | 3 refills | Status: AC
Start: 2023-09-04 — End: 2024-08-29

## 2023-09-04 MED ORDER — SPIRONOLACTONE 50 MG PO TABS
50.0000 mg | ORAL_TABLET | Freq: Every day | ORAL | 3 refills | Status: DC
Start: 2023-09-04 — End: 2023-11-20

## 2023-09-04 NOTE — Progress Notes (Signed)
 Assessment & Plan   Assessment/Plan:    Assessment & Plan Hypertension Hypertension management is complicated by intolerance to valsartan  and amlodipine, and anaphylaxis to beta blockers. Previous valsartan  with hydrochlorothiazide  caused lightheadedness, fatigue, headache, and hand swelling. Blood pressure remains elevated with systolic readings between 139-160 mmHg and diastolic readings between 96-100 mmHg. Current regimen of chlorthalidone  25 mg and spironolactone  50 mg is well-tolerated but suboptimal for blood pressure control. Increasing spironolactone  may cause hyperkalemia and hormonal effects, particularly in men. Consider referral to a specialty clinic for resistant hypertension if adjustments are ineffective. - Discontinue valsartan  HCTZ. - Resume chlorthalidone  50 mg daily. - Resume spironolactone  50 mg daily. - Monitor blood pressure at home regularly. - Increase spironolactone  to 100 mg daily if blood pressure remains above 140/90 mmHg. - Monitor potassium levels due to risk of hyperkalemia. - Refill potassium 10 mEq daily. - Consider referral to a specialty hypertension clinic if blood pressure remains uncontrolled.  Intolerance to valsartan  and amlodipine Intolerance to valsartan  characterized by lightheadedness, fatigue, headache, and hand swelling. Amlodipine previously caused significant swelling, limiting her use in hypertension management.  Anaphylaxis to beta blockers Anaphylaxis to beta blockers precludes her use in hypertension management.  Follow-up Follow-up appointment scheduled in two weeks to reassess blood pressure control and medication tolerance. - Follow up in two weeks for blood pressure reassessment and medication review.      Medications Discontinued During This Encounter  Medication Reason   valsartan -hydrochlorothiazide  (DIOVAN -HCT) 160-25 MG tablet             Subjective:   Encounter date: 09/04/2023  Jasmine Baird is a 38  y.o. female who has Chronic hypertension; Asthma; Seasonal allergies; Fibroids; Hypokalemia; Encounter for preoperative assessment; Class 1 obesity with serious comorbidity in adult; Intramural, submucous, and subserous leiomyoma of uterus; and Encounter for IUD removal on their problem list..   She  has a past medical history of Asthma, Fibroids, History of pre-eclampsia, Hypertension, and Hypokalemia..   She presents with chief complaint of Hypertension (2 week follow up HTN and repeat labs. Pt c/o of lightheadedness, dizziness; joint swelling and pain, fatigue and chest discomfort after taking valsartan -hydrochlorothiazide . //HM - updated ) .   Discussed the use of AI scribe software for clinical note transcription with the patient, who gave verbal consent to proceed.  History of Present Illness Jasmine Baird is a 38 year old female with hypertension who presents with medication intolerance and elevated blood pressure.  She has a history of hypertension and was previously managed on chlorthalidone . During her last visit, her blood pressure was elevated, leading to a change in her medication regimen to a combination of valsartan  and hydrochlorothiazide . She experienced adverse effects including lightheadedness, fatigue, headaches, and swelling in her hands with joint pain, which she attributed to the medication change.  She attempted to take valsartan  alone to see if the symptoms would improve, but continued to feel tired and experienced headaches. Consequently, she resumed her previous regimen of chlorthalidone  25 mg and spironolactone  50 mg, which she reports feeling better on without side effects.  She has not consistently measured her blood pressure at home since the medication change, but noted that during the weekend of taking valsartan , her readings were similar to previous levels. Her blood pressure readings at home and in the office have been high, with systolic readings between 139  and 160 and diastolic readings between 96 and 100.  She has a history of anaphylaxis with beta blockers and experienced worsened swelling  with amlodipine. Her current medications include chlorthalidone  25 mg and spironolactone  50 mg. No chest pain, shortness of breath, palpitations, or urinary symptoms such as burning, increased frequency, or back pain. Her cholesterol levels have remained stable, with recent measurements showing a total cholesterol of 146 and LDL of 69.     ROS  Past Surgical History:  Procedure Laterality Date   CYSTOSCOPY N/A 09/10/2022   Procedure: CYSTOSCOPY;  Surgeon: Kiki Pelton, MD;  Location: [facility];  Service: Gynecology;  Laterality: N/A;   IUD REMOVAL  09/10/2022   Procedure: INTRAUTERINE DEVICE (IUD) REMOVAL;  Surgeon: Kiki Pelton, MD;  Location: [facility];  Service: Gynecology;;   ROBOTIC ASSISTED TOTAL HYSTERECTOMY WITH BILATERAL SALPINGO OOPHERECTOMY Bilateral 09/10/2022   Procedure: XI ROBOTIC ASSISTED TOTAL HYSTERECTOMY WITH BILATERAL SALPINGECTOMY;  Surgeon: Kiki Pelton, MD;  Location: [facility];  Service: Gynecology;  Laterality: Bilateral;    Outpatient Medications Prior to Visit  Medication Sig Dispense Refill   albuterol  (VENTOLIN  HFA) 108 (90 Base) MCG/ACT inhaler Inhale 1-2 puffs into the lungs every 6 (six) hours as needed for wheezing or shortness of breath. 1 each 11   cetirizine  (ZYRTEC ) 10 MG tablet Take 1 tablet (10 mg total) by mouth daily. 90 tablet 3   chlorthalidone  (HYGROTON ) 25 MG tablet Take 25 mg by mouth daily.     fluticasone  (FLONASE ) 50 MCG/ACT nasal spray Place 2 sprays into both nostrils daily. 1 g 11   montelukast  (SINGULAIR ) 10 MG tablet Take 1 tablet (10 mg total) by mouth at bedtime. 90 tablet 3   spironolactone  (ALDACTONE ) 50 MG tablet Take 1 tablet (50 mg total) by mouth daily. 90 tablet 3   valsartan -hydrochlorothiazide  (DIOVAN -HCT) 160-25 MG tablet Take  1 tablet by mouth daily. 90 tablet 3   No facility-administered medications prior to visit.    Family History  Problem Relation Age of Onset   Hypertension Mother    Cancer Father    Hypertension Father    Hyperlipidemia Father    Asthma Daughter    Allergies Daughter    Asthma Son    Heart disease Maternal Uncle    Hyperlipidemia Maternal Grandmother    Diabetes Maternal Grandmother    Hypertension Maternal Grandmother    Cancer Maternal Grandfather 73       PANCREATIC    Social History   Socioeconomic History   Marital status: Divorced    Spouse name: Alfonzo Mcgrady   Number of children: 2   Years of education: Not on file   Highest education level: Associate degree: occupational, Scientist, product/process development, or vocational program  Occupational History   Occupation: Paralegal  Tobacco Use   Smoking status: Never    Passive exposure: Never   Smokeless tobacco: Never  Vaping Use   Vaping status: Never Used  Substance and Sexual Activity   Alcohol use: Yes    Comment: very rare   Drug use: No   Sexual activity: Yes    Birth control/protection: I.U.D.  Other Topics Concern   Not on file  Social History Narrative   1. Son, [name], 30 yo,    2. Daughter, Rayma Calandra, 76 yo    Social Drivers of Corporate investment banker Strain: Low Risk  (08/21/2023)   Overall Financial Resource Strain (CARDIA)    Difficulty of Paying Living Expenses: Not very hard  Food Insecurity: No Food Insecurity (08/21/2023)   Hunger Vital Sign    Worried About Running Out of Food in the Last  Year: Never true    Ran Out of Food in the Last Year: Never true  Transportation Needs: No Transportation Needs (08/21/2023)   PRAPARE - Administrator, Civil Service (Medical): No    Lack of Transportation (Non-Medical): No  Physical Activity: Insufficiently Active (08/21/2023)   Exercise Vital Sign    Days of Exercise per Week: 2 days    Minutes of Exercise per Session: 50 min  Stress: No Stress  Concern Present (08/21/2023)   [employer] of Occupational Health - Occupational Stress Questionnaire    Feeling of Stress : Not at all  Social Connections: Socially Isolated (08/21/2023)   Social Connection and Isolation Panel [NHANES]    Frequency of Communication with Friends and Family: More than three times a week    Frequency of Social Gatherings with Friends and Family: Once a week    Attends Religious Services: Never    Database administrator or Organizations: No    Attends Engineer, structural: Not on file    Marital Status: Divorced  Catering manager Violence: Not on file                                                                                                  Objective:  Physical Exam: Temp (!) 97 F (36.1 C) (Temporal)   Resp 18   Wt 162 lb 6.4 oz (73.7 kg)   LMP 03/25/2022   SpO2 100%   BMI 30.69 kg/m    Physical Exam VITALS: BP- 139/96 GENERAL: Alert, cooperative, well developed, no acute distress HEENT: Normocephalic, normal oropharynx, moist mucous membranes CHEST: Clear to auscultation bilaterally, no wheezes, rhonchi, or crackles CARDIOVASCULAR: Normal heart rate and rhythm, S1 and S2 normal without murmurs ABDOMEN: Soft, non-tender, non-distended, without organomegaly, normal bowel sounds EXTREMITIES: No cyanosis or edema NEUROLOGICAL: Cranial nerves grossly intact, moves all extremities without gross motor or sensory deficit  Orthostatic VS for the past 72 hrs (Last 3 readings):  Orthostatic BP Patient Position BP Location Cuff Size Orthostatic Pulse  09/04/23 0805 (!) 160/100 Supine Right Arm Large 97  09/04/23 0803 (!) 151/100 Standing Left Arm Large 99  09/04/23 0802 (!) 139/96 Sitting Left Arm Large 91     Physical Exam  No results found.  Recent Results (from the past 2160 hours)  Lipid panel     Status: None   Collection Time: 08/21/23  9:53 AM  Result Value Ref Range   Cholesterol 146 0 - 200 mg/dL    Comment: ATP  III Classification       Desirable:  < 200 mg/dL               Borderline High:  200 - 239 mg/dL          High:  > = 161 mg/dL   Triglycerides 096.0 0.0 - 149.0 mg/dL    Comment: Normal:  <454 mg/dLBorderline High:  150 - 199 mg/dL   HDL 09.81 >19.14 mg/dL   VLDL 78.2 0.0 - 95.6 mg/dL   LDL Cholesterol 69 0 - 99 mg/dL   Total CHOL/HDL  Ratio 3     Comment:                Men          Women1/2 Average Risk     3.4          3.3Average Risk          5.0          4.42X Average Risk          9.6          7.13X Average Risk          15.0          11.0                       NonHDL 94.00     Comment: NOTE:  Non-HDL goal should be 30 mg/dL higher than patient's LDL goal (i.e. LDL goal of < 70 mg/dL, would have non-HDL goal of < 100 mg/dL)  Hemoglobin W1U     Status: None   Collection Time: 08/21/23  9:53 AM  Result Value Ref Range   Hgb A1c MFr Bld 5.5 4.6 - 6.5 %    Comment: Glycemic Control Guidelines for People with Diabetes:Non Diabetic:  <6%Goal of Therapy: <7%Additional Action Suggested:  >8%   Microalbumin / creatinine urine ratio     Status: None   Collection Time: 08/21/23  9:53 AM  Result Value Ref Range   Microalb, Ur <0.7 mg/dL   Creatinine,U 27.2 mg/dL   Microalb Creat Ratio Unable to calculate 0.0 - 30.0 mg/g    Comment: Unable to Calculate due to Microalbumin Result of <0.7 mg/dL  Vitamin D 5,36 dihydroxy     Status: None   Collection Time: 08/21/23  9:53 AM  Result Value Ref Range   Vitamin D 1, 25 (OH)2 Total 19 18 - 72 pg/mL   Vitamin D3 1, 25 (OH)2 19 pg/mL   Vitamin D2 1, 25 (OH)2 <8 pg/mL    Comment: (Note) Vitamin D3, 1,25(OH)2 indicates both endogenous  production and supplementation. Vitamin D2, 1,25(OH)2 is  an indicator of exogenous sources, such as diet or  supplementation. Interpretation and therapy are based on  measurement of Vitamin D, 1,25 (OH)2, Total. . This test was developed, and its analytical performance  characteristics have been determined by [employer]. It has not been cleared or approved by the  FDA. This assay has been validated pursuant to the CLIA  regulations and is used for clinical purposes. . For additional information, please refer to http://education.[facility].com/faq/FAQ199 (This link is being provided for  informational/educational purposes only.) . [facility] med [facility] [address] [facility] [address] [place] [address] [phone] Debroah Fanning L. Amaryllis Junior, MD, PhD   CBC with Differential/Platelet     Status: Abnormal   Collection Time: 08/21/23  9:53 AM  Result Value Ref Range   WBC 9.1 4.0 - 10.5 K/uL   RBC 5.21 (H) 3.87 - 5.11 Mil/uL   Hemoglobin 15.0 12.0 - 15.0 g/dL   HCT 75.6 43.3 - 29.5 %   MCV 85.0 78.0 - 100.0 fl   MCHC 33.9 30.0 - 36.0 g/dL   RDW 18.8 41.6 - 60.6 %   Platelets 218.0 150.0 - 400.0 K/uL   Neutrophils Relative % 67.3 43.0 - 77.0 %   Lymphocytes Relative 23.6 12.0 - 46.0 %   Monocytes Relative 5.8 3.0 - 12.0 %   Eosinophils Relative 2.8 0.0 - 5.0 %  Basophils Relative 0.5 0.0 - 3.0 %   Neutro Abs 6.1 1.4 - 7.7 K/uL   Lymphs Abs 2.1 0.7 - 4.0 K/uL   Monocytes Absolute 0.5 0.1 - 1.0 K/uL   Eosinophils Absolute 0.3 0.0 - 0.7 K/uL   Basophils Absolute 0.0 0.0 - 0.1 K/uL  Comprehensive metabolic panel with GFR     Status: None   Collection Time: 08/21/23  9:53 AM  Result Value Ref Range   Sodium 135 135 - 145 mEq/L   Potassium 3.8 3.5 - 5.1 mEq/L   Chloride 97 96 - 112 mEq/L   CO2 28 19 - 32 mEq/L   Glucose, Bld 92 70 - 99 mg/dL   BUN 16 6 - 23 mg/dL   Creatinine, Ser 4.78 0.40 - 1.20 mg/dL   Total Bilirubin 0.6 0.2 - 1.2 mg/dL   Alkaline Phosphatase 78 39 - 117 U/L   AST 19 0 - 37 U/L   ALT 12 0 - 35 U/L   Total Protein 7.8 6.0 - 8.3 g/dL   Albumin 4.8 3.5 - 5.2 g/dL   GFR 29.56 >21.30 mL/min    Comment: Calculated using the CKD-EPI Creatinine Equation (2021)   Calcium 9.9 8.4 - 10.5 mg/dL  Urinalysis w microscopic + reflex cultur     Status: Abnormal    Collection Time: 08/21/23  9:53 AM   Specimen: Blood  Result Value Ref Range   Color, Urine YELLOW YELLOW   APPearance CLEAR CLEAR   Specific Gravity, Urine 1.013 1.001 - 1.035   pH 8.0 5.0 - 8.0   Glucose, UA NEGATIVE NEGATIVE   Bilirubin Urine NEGATIVE NEGATIVE   Ketones, ur NEGATIVE NEGATIVE   Hgb urine dipstick NEGATIVE NEGATIVE   Protein, ur NEGATIVE NEGATIVE   Nitrites, Initial NEGATIVE NEGATIVE   Leukocyte Esterase TRACE (A) NEGATIVE   WBC, UA NONE SEEN 0 - 5 /HPF   RBC / HPF NONE SEEN 0 - 2 /HPF   Squamous Epithelial / HPF 10-20 (A) < OR = 5 /HPF   Bacteria, UA NONE SEEN NONE SEEN /HPF   Hyaline Cast NONE SEEN NONE SEEN /LPF   Note      Comment: This urine was analyzed for the presence of WBC,  RBC, bacteria, casts, and other formed elements.  Only those elements seen were reported. . .   Thyroid  Panel With TSH     Status: None   Collection Time: 08/21/23  9:53 AM  Result Value Ref Range   T3 Uptake 26 22 - 35 %   T4, Total 9.7 5.1 - 11.9 mcg/dL   Free Thyroxine Index 2.5 1.4 - 3.8   TSH 0.92 mIU/L    Comment:           Reference Range .           > or = 20 Years  0.40-4.50 .                Pregnancy Ranges           First trimester    0.26-2.66           Second trimester   0.55-2.73           Third trimester    0.43-2.91   Urine Culture     Status: Abnormal   Collection Time: 08/21/23  9:53 AM  Result Value Ref Range   MICRO NUMBER: [id number]    SPECIMEN QUALITY: Adequate    Sample Source URINE    STATUS:  FINAL    ISOLATE 1: Escherichia coli (A)     Comment: Greater than 100,000 CFU/mL of Escherichia coli      Susceptibility   Escherichia coli - URINE CULTURE, REFLEX    AMOX/CLAVULANIC <=2 Sensitive     AMPICILLIN/SULBACTAM <=2 Sensitive     CEFAZOLIN * <=4 Not Reportable      * For infections other than uncomplicated UTI caused by E. coli, K. pneumoniae or P. mirabilis: Cefazolin  is resistant if MIC > or = 8 mcg/mL. (Distinguishing susceptible versus  intermediate for isolates with MIC < or = 4 mcg/mL requires additional testing.) For uncomplicated UTI caused by E. coli, K. pneumoniae or P. mirabilis: Cefazolin  is susceptible if MIC <32 mcg/mL and predicts susceptible to the oral agents cefaclor, cefdinir, cefpodoxime, cefprozil, cefuroxime, cephalexin and loracarbef.     CEFTAZIDIME <=1 Sensitive     CEFEPIME <=0.12 Sensitive     CEFTRIAXONE <=0.25 Sensitive     CIPROFLOXACIN <=0.06 Sensitive     LEVOFLOXACIN <=0.12 Sensitive     GENTAMICIN <=1 Sensitive     IMIPENEM <=0.25 Sensitive     MEROPENEM <=0.25 Sensitive     NITROFURANTOIN <=16 Sensitive     PIP/TAZO <=4 Sensitive     TRIMETH/SULFA* <=20 Sensitive      * For infections other than uncomplicated UTI caused by E. coli, K. pneumoniae or P. mirabilis: Cefazolin  is resistant if MIC > or = 8 mcg/mL. (Distinguishing susceptible versus intermediate for isolates with MIC < or = 4 mcg/mL requires additional testing.) For uncomplicated UTI caused by E. coli, K. pneumoniae or P. mirabilis: Cefazolin  is susceptible if MIC <32 mcg/mL and predicts susceptible to the oral agents cefaclor, cefdinir, cefpodoxime, cefprozil, cefuroxime, cephalexin and loracarbef. Legend: S = Susceptible  I = Intermediate R = Resistant  NS = Not susceptible SDD = Susceptible Dose Dependent * = Not Tested  NR = Not Reported **NN = See Therapy Comments   REFLEXIVE URINE CULTURE     Status: None   Collection Time: 08/21/23  9:53 AM  Result Value Ref Range   REFLEXIVE URINE CULTURE      Comment: CULTURE INDICATED - RESULTS TO FOLLOW        Carnell Christian, MD, MS

## 2023-09-25 ENCOUNTER — Ambulatory Visit: Payer: Self-pay | Admitting: Family Medicine

## 2023-09-25 ENCOUNTER — Ambulatory Visit (INDEPENDENT_AMBULATORY_CARE_PROVIDER_SITE_OTHER): Admitting: Family Medicine

## 2023-09-25 ENCOUNTER — Encounter: Payer: Self-pay | Admitting: Family Medicine

## 2023-09-25 VITALS — BP 120/84 | HR 84 | Temp 98.0°F | Ht 61.0 in | Wt 160.6 lb

## 2023-09-25 DIAGNOSIS — R8271 Bacteriuria: Secondary | ICD-10-CM | POA: Insufficient documentation

## 2023-09-25 DIAGNOSIS — Z0000 Encounter for general adult medical examination without abnormal findings: Secondary | ICD-10-CM

## 2023-09-25 DIAGNOSIS — F411 Generalized anxiety disorder: Secondary | ICD-10-CM | POA: Insufficient documentation

## 2023-09-25 DIAGNOSIS — I10 Essential (primary) hypertension: Secondary | ICD-10-CM | POA: Diagnosis not present

## 2023-09-25 LAB — BASIC METABOLIC PANEL WITH GFR
BUN: 15 mg/dL (ref 6–23)
CO2: 31 meq/L (ref 19–32)
Calcium: 9.9 mg/dL (ref 8.4–10.5)
Chloride: 99 meq/L (ref 96–112)
Creatinine, Ser: 0.85 mg/dL (ref 0.40–1.20)
GFR: 86.43 mL/min (ref >60.00)
Glucose, Bld: 91 mg/dL (ref 70–99)
Potassium: 3.8 meq/L (ref 3.5–5.1)
Sodium: 135 meq/L (ref 135–145)

## 2023-09-25 MED ORDER — ESCITALOPRAM OXALATE 10 MG PO TABS
ORAL_TABLET | ORAL | 0 refills | Status: DC
Start: 2023-09-25 — End: 2023-10-17

## 2023-09-25 NOTE — Progress Notes (Signed)
 Assessment  Assessment/Plan:  Assessment and Plan Assessment & Plan Generalized Anxiety Disorder (GAD) Long-standing anxiety with constant worry, stress, and social avoidance. She acknowledges potential impact on blood pressure and overall well-being. Prefers pharmacological treatment. Escitalopram chosen for its effect on serotonin levels, starting at a low dose to minimize side effects with gradual increase. - Initiate escitalopram 5 mg daily for one week, then increase to 10 mg daily. - Follow up in one month to assess response to medication and discuss further management options.  Hypertension Blood pressure well-controlled with spironolactone  and chlorthalidone , both at 50 mg. Potassium supplements taken. Anxiety may contribute to blood pressure fluctuations. Concerns about home blood pressure monitor accuracy. Importance of checking potassium levels discussed due to potential cardiac effects. - Continue spironolactone  and chlorthalidone . - Recheck potassium levels with a basic metabolic panel. - Consider bringing home blood pressure monitor to the clinic for calibration.  Post-Hysterectomy Status Hysterectomy performed one year ago for fibroids and polyps. No current OB/GYN follow-up for hormonal assessment. Hormonal changes may affect overall health. - Consider follow-up with OB/GYN for hormonal assessment and management post-hysterectomy.  General Health Maintenance Up to date on vaccinations. Discussed pneumonia vaccine due to changes in recommendations for individuals with risk factors. No mammogram since hysterectomy. - Discuss pneumonia vaccination based on risk factors.  Asymptomatic       There are no discontinued medications.  Patient Counseling(The following topics were reviewed and/or handout was given):  -Nutrition: Stressed importance of moderation in sodium/caffeine intake, saturated fat and cholesterol, caloric balance, sufficient intake of fresh fruits,  vegetables, and fiber.  -Stressed the importance of regular exercise.   -Substance Abuse: Discussed cessation/primary prevention of tobacco, alcohol, or other drug use; driving or other dangerous activities under the influence; availability of treatment for abuse.   -Injury prevention: Discussed safety belts, safety helmets, smoke detector, smoking near bedding or upholstery.   -Sexuality: Discussed sexually transmitted diseases, partner selection, use of condoms, avoidance of unintended pregnancy and contraceptive alternatives.   -Dental health: Discussed importance of regular tooth brushing, flossing, and dental visits.  -Health maintenance and immunizations reviewed. Please refer to Health maintenance section.  Return in about 1 month (around 10/25/2023) for blood presssure, anxiety.        Subjective:   Encounter date: 09/25/2023  Chief Complaint  Patient presents with   Annual Exam    Fasting     Discussed the use of AI scribe software for clinical note transcription with the patient, who gave verbal consent to proceed.  History of Present Illness Jasmine Baird is a 38 year old female with hypertension who presents for a routine physical exam and management of anxiety.  She has hypertension and is currently on spironolactone  50 mg and chlorthalidone  50 mg, with added potassium supplementation. Her electrolytes have been stable. No palpitations, swelling, fever, or chills.  She underwent a hysterectomy about a year ago due to fibroids and polyps. She has not had a follow-up with her OB GYN since the surgery.  She describes her anxiety as a constant worry about uncontrollable things, which has been present for as long as she can remember. It causes her to avoid groups and feel overwhelmed in certain situations. She experiences difficulty sleeping due to overthinking and sometimes avoids going out. She has not previously sought treatment for anxiety but is now considering it due  to its potential impact on her blood pressure.  No chest pain, shortness of breath, or thoughts of self-harm. She is bilingual  and primarily communicates in [place].       09/25/2023    9:14 AM 09/04/2023    8:02 AM 08/21/2023    9:11 AM 02/13/2023   11:17 AM 08/30/2021    8:03 AM  Depression screen PHQ 2/9  Decreased Interest 1 0 0 0 0  Down, Depressed, Hopeless 1 0 0 0 0  PHQ - 2 Score 2 0 0 0 0  Altered sleeping 1   0   Tired, decreased energy 1   0   Change in appetite 1   0   Feeling bad or failure about yourself  0   0   Trouble concentrating 1   0   Moving slowly or fidgety/restless 0   0   Suicidal thoughts 0   0   PHQ-9 Score 6   0   Difficult doing work/chores Not difficult at all   Not difficult at all        09/25/2023    9:15 AM 02/13/2023   11:17 AM  GAD 7 : Generalized Anxiety Score  Nervous, Anxious, on Edge 2 0  Control/stop worrying 2 0  Worry too much - different things 2 0  Trouble relaxing 2 0  Restless 2 0  Easily annoyed or irritable 2 0  Afraid - awful might happen 1 0  Total GAD 7 Score 13 0  Anxiety Difficulty Not difficult at all Not difficult at all    Health Maintenance Due  Topic Date Due   HPV VACCINES (1 - 3-dose SCDM series) Never done      PMH:  The following were reviewed and entered/updated in epic: Past Medical History:  Diagnosis Date   Asthma    Follows w/ PCP, Kasandra Pain, MD.   Fibroids    History of pre-eclampsia    w/ all pregnancies   Hypertension    Follows w/ PCP, Kasandra Pain, MD.   Hypokalemia    mild, treated w/ OTC K+ supplements    Patient Active Problem List   Diagnosis Date Noted   Asymptomatic bacteriuria 09/25/2023   GAD (generalized anxiety disorder) 09/25/2023   Intramural, submucous, and subserous leiomyoma of uterus 09/10/2022   Encounter for IUD removal 09/10/2022   Encounter for preoperative assessment 08/16/2022   Hypokalemia 08/30/2021   Chronic hypertension 08/17/2021   Asthma  08/17/2021   Seasonal allergies 08/17/2021   Fibroids 08/17/2021   Class 1 obesity with serious comorbidity in adult 08/17/2021    Past Surgical History:  Procedure Laterality Date   CYSTOSCOPY N/A 09/10/2022   Procedure: CYSTOSCOPY;  Surgeon: Kiki Pelton, MD;  Location: [facility];  Service: Gynecology;  Laterality: N/A;   IUD REMOVAL  09/10/2022   Procedure: INTRAUTERINE DEVICE (IUD) REMOVAL;  Surgeon: Kiki Pelton, MD;  Location: [facility];  Service: Gynecology;;   ROBOTIC ASSISTED TOTAL HYSTERECTOMY WITH BILATERAL SALPINGO OOPHERECTOMY Bilateral 09/10/2022   Procedure: XI ROBOTIC ASSISTED TOTAL HYSTERECTOMY WITH BILATERAL SALPINGECTOMY;  Surgeon: Kiki Pelton, MD;  Location: [facility];  Service: Gynecology;  Laterality: Bilateral;    Family History  Problem Relation Age of Onset   Hypertension Mother    Cancer Father    Hypertension Father    Hyperlipidemia Father    Asthma Daughter    Allergies Daughter    Asthma Son    Heart disease Maternal Uncle    Hyperlipidemia Maternal Grandmother    Diabetes Maternal Grandmother    Hypertension Maternal Grandmother    Cancer Maternal  Grandfather 64       PANCREATIC    Medications- reviewed and updated Outpatient Medications Prior to Visit  Medication Sig Dispense Refill   albuterol  (VENTOLIN  HFA) 108 (90 Base) MCG/ACT inhaler Inhale 1-2 puffs into the lungs every 6 (six) hours as needed for wheezing or shortness of breath. 1 each 11   cetirizine  (ZYRTEC ) 10 MG tablet Take 1 tablet (10 mg total) by mouth daily. 90 tablet 3   chlorthalidone  (HYGROTON ) 25 MG tablet Take 2 tablets (50 mg total) by mouth daily. 90 tablet 3   fluticasone  (FLONASE ) 50 MCG/ACT nasal spray Place 2 sprays into both nostrils daily. 1 g 11   montelukast  (SINGULAIR ) 10 MG tablet Take 1 tablet (10 mg total) by mouth at bedtime. 90 tablet 3   potassium chloride  SA (KLOR-CON  M) 10 MEQ tablet Take 1  tablet (10 mEq total) by mouth daily. 90 tablet 3   spironolactone  (ALDACTONE ) 50 MG tablet Take 1 tablet (50 mg total) by mouth daily. 90 tablet 3   No facility-administered medications prior to visit.    Allergies  Allergen Reactions   Beta Adrenergic Blockers Anaphylaxis   Lisinopril  Anaphylaxis    Any beta blockers same reaction   Amlodipine Other (See Comments)    Swelling in hands   Valsartan  Other (See Comments)    Tired    Social History   Socioeconomic History   Marital status: Divorced    Spouse name: Alfonzo Abrahamsen   Number of children: 2   Years of education: Not on file   Highest education level: Associate degree: occupational, Scientist, product/process development, or vocational program  Occupational History   Occupation: Paralegal  Tobacco Use   Smoking status: Never    Passive exposure: Never   Smokeless tobacco: Never  Vaping Use   Vaping status: Never Used  Substance and Sexual Activity   Alcohol use: Yes    Comment: very rare   Drug use: No   Sexual activity: Yes    Birth control/protection: I.U.D.  Other Topics Concern   Not on file  Social History Narrative   1. Son, [name], 23 yo,    2. Daughter, Rayma Calandra, 93 yo    Social Drivers of Corporate investment banker Strain: Low Risk  (09/21/2023)   Overall Financial Resource Strain (CARDIA)    Difficulty of Paying Living Expenses: Not very hard  Food Insecurity: No Food Insecurity (09/21/2023)   Hunger Vital Sign    Worried About Running Out of Food in the Last Year: Never true    Ran Out of Food in the Last Year: Never true  Transportation Needs: No Transportation Needs (09/21/2023)   PRAPARE - Administrator, Civil Service (Medical): No    Lack of Transportation (Non-Medical): No  Physical Activity: Insufficiently Active (09/21/2023)   Exercise Vital Sign    Days of Exercise per Week: 1 day    Minutes of Exercise per Session: 40 min  Stress: Stress Concern Present (09/21/2023)   [employer] of  Occupational Health - Occupational Stress Questionnaire    Feeling of Stress: Rather much  Social Connections: Socially Isolated (09/21/2023)   Social Connection and Isolation Panel    Frequency of Communication with Friends and Family: More than three times a week    Frequency of Social Gatherings with Friends and Family: Once a week    Attends Religious Services: Never    Database administrator or Organizations: No    Attends Engineer, structural: Not  on file    Marital Status: Divorced           Objective:  Physical Exam: BP 120/84   Pulse 84   Temp 98 F (36.7 C) (Temporal)   Ht 5' 1 (1.549 m)   Wt 160 lb 9.6 oz (72.8 kg)   LMP 03/25/2022   SpO2 99%   BMI 30.35 kg/m   Body mass index is 30.35 kg/m. Wt Readings from Last 3 Encounters:  09/25/23 160 lb 9.6 oz (72.8 kg)  09/04/23 162 lb 6.4 oz (73.7 kg)  08/21/23 158 lb 9.6 oz (71.9 kg)    Physical Exam Constitutional:      General: She is not in acute distress.    Appearance: Normal appearance. She is not ill-appearing or toxic-appearing.  HENT:     Head: Normocephalic and atraumatic.     Right Ear: Hearing, tympanic membrane, ear canal and external ear normal. There is no impacted cerumen.     Left Ear: Hearing, tympanic membrane, ear canal and external ear normal. There is no impacted cerumen.     Nose: Nose normal. No congestion.     Mouth/Throat:     Lips: No lesions.     Mouth: Mucous membranes are moist.     Pharynx: Oropharynx is clear. No oropharyngeal exudate.   Eyes:     General: No scleral icterus.       Right eye: No discharge.        Left eye: No discharge.     Conjunctiva/sclera: Conjunctivae normal.     Pupils: Pupils are equal, round, and reactive to light.   Neck:     Thyroid : No thyroid  mass, thyromegaly or thyroid  tenderness.   Cardiovascular:     Rate and Rhythm: Normal rate and regular rhythm.     Pulses: Normal pulses.     Heart sounds: Normal heart sounds.  Pulmonary:      Effort: Pulmonary effort is normal. No respiratory distress.     Breath sounds: Normal breath sounds.  Abdominal:     General: Abdomen is flat. Bowel sounds are normal.     Palpations: Abdomen is soft.   Musculoskeletal:        General: Normal range of motion.     Cervical back: Normal range of motion.     Right lower leg: No edema.     Left lower leg: No edema.  Lymphadenopathy:     Cervical: No cervical adenopathy.   Skin:    General: Skin is warm and dry.     Findings: No rash.   Neurological:     General: No focal deficit present.     Mental Status: She is alert and oriented to person, place, and time. Mental status is at baseline.     Deep Tendon Reflexes:     Reflex Scores:      Patellar reflexes are 2+ on the right side and 2+ on the left side.  Psychiatric:        Mood and Affect: Mood is anxious. Affect is tearful.        Behavior: Behavior normal.        Thought Content: Thought content normal.        Judgment: Judgment normal.         Prior labs:   Recent Results (from the past 2160 hours)  Lipid panel     Status: None   Collection Time: 08/21/23  9:53 AM  Result Value Ref Range   Cholesterol 146  0 - 200 mg/dL    Comment: ATP III Classification       Desirable:  < 200 mg/dL               Borderline High:  200 - 239 mg/dL          High:  > = 161 mg/dL   Triglycerides 096.0 0.0 - 149.0 mg/dL    Comment: Normal:  <454 mg/dLBorderline High:  150 - 199 mg/dL   HDL 09.81 >19.14 mg/dL   VLDL 78.2 0.0 - 95.6 mg/dL   LDL Cholesterol 69 0 - 99 mg/dL   Total CHOL/HDL Ratio 3     Comment:                Men          Women1/2 Average Risk     3.4          3.3Average Risk          5.0          4.42X Average Risk          9.6          7.13X Average Risk          15.0          11.0                       NonHDL 94.00     Comment: NOTE:  Non-HDL goal should be 30 mg/dL higher than patient's LDL goal (i.e. LDL goal of < 70 mg/dL, would have non-HDL goal of < 100 mg/dL)   Hemoglobin O1H     Status: None   Collection Time: 08/21/23  9:53 AM  Result Value Ref Range   Hgb A1c MFr Bld 5.5 4.6 - 6.5 %    Comment: Glycemic Control Guidelines for People with Diabetes:Non Diabetic:  <6%Goal of Therapy: <7%Additional Action Suggested:  >8%   Microalbumin / creatinine urine ratio     Status: None   Collection Time: 08/21/23  9:53 AM  Result Value Ref Range   Microalb, Ur <0.7 mg/dL   Creatinine,U 08.6 mg/dL   Microalb Creat Ratio Unable to calculate 0.0 - 30.0 mg/g    Comment: Unable to Calculate due to Microalbumin Result of <0.7 mg/dL  Vitamin D  1,25 dihydroxy     Status: None   Collection Time: 08/21/23  9:53 AM  Result Value Ref Range   Vitamin D  1, 25 (OH)2 Total 19 18 - 72 pg/mL   Vitamin D3 1, 25 (OH)2 19 pg/mL   Vitamin D2 1, 25 (OH)2 <8 pg/mL    Comment: (Note) Vitamin D3, 1,25(OH)2 indicates both endogenous  production and supplementation. Vitamin D2, 1,25(OH)2 is  an indicator of exogenous sources, such as diet or  supplementation. Interpretation and therapy are based on  measurement of Vitamin D , 1,25 (OH)2, Total. . This test was developed, and its analytical performance  characteristics have been determined by [employer]. It has not been cleared or approved by the  FDA. This assay has been validated pursuant to the CLIA  regulations and is used for clinical purposes. . For additional information, please refer to http://education.[facility].com/faq/FAQ199 (This link is being provided for  informational/educational purposes only.) . [facility] [address] [place] [address] [phone] Debroah Fanning L. Amaryllis Junior, MD, PhD   CBC with Differential/Platelet     Status: Abnormal   Collection Time: 08/21/23  9:53 AM  Result Value Ref Range   WBC 9.1 4.0 - 10.5 K/uL   RBC 5.21 (H) 3.87 - 5.11 Mil/uL   Hemoglobin 15.0 12.0 - 15.0 g/dL   HCT 95.2 84.1 - 32.4 %   MCV 85.0 78.0 - 100.0 fl   MCHC  33.9 30.0 - 36.0 g/dL   RDW 40.1 02.7 - 25.3 %   Platelets 218.0 150.0 - 400.0 K/uL   Neutrophils Relative % 67.3 43.0 - 77.0 %   Lymphocytes Relative 23.6 12.0 - 46.0 %   Monocytes Relative 5.8 3.0 - 12.0 %   Eosinophils Relative 2.8 0.0 - 5.0 %   Basophils Relative 0.5 0.0 - 3.0 %   Neutro Abs 6.1 1.4 - 7.7 K/uL   Lymphs Abs 2.1 0.7 - 4.0 K/uL   Monocytes Absolute 0.5 0.1 - 1.0 K/uL   Eosinophils Absolute 0.3 0.0 - 0.7 K/uL   Basophils Absolute 0.0 0.0 - 0.1 K/uL  Comprehensive metabolic panel with GFR     Status: None   Collection Time: 08/21/23  9:53 AM  Result Value Ref Range   Sodium 135 135 - 145 mEq/L   Potassium 3.8 3.5 - 5.1 mEq/L   Chloride 97 96 - 112 mEq/L   CO2 28 19 - 32 mEq/L   Glucose, Bld 92 70 - 99 mg/dL   BUN 16 6 - 23 mg/dL   Creatinine, Ser 6.64 0.40 - 1.20 mg/dL   Total Bilirubin 0.6 0.2 - 1.2 mg/dL   Alkaline Phosphatase 78 39 - 117 U/L   AST 19 0 - 37 U/L   ALT 12 0 - 35 U/L   Total Protein 7.8 6.0 - 8.3 g/dL   Albumin 4.8 3.5 - 5.2 g/dL   GFR 40.34 >74.25 mL/min    Comment: Calculated using the CKD-EPI Creatinine Equation (2021)   Calcium 9.9 8.4 - 10.5 mg/dL  Urinalysis w microscopic + reflex cultur     Status: Abnormal   Collection Time: 08/21/23  9:53 AM   Specimen: Blood  Result Value Ref Range   Color, Urine YELLOW YELLOW   APPearance CLEAR CLEAR   Specific Gravity, Urine 1.013 1.001 - 1.035   pH 8.0 5.0 - 8.0   Glucose, UA NEGATIVE NEGATIVE   Bilirubin Urine NEGATIVE NEGATIVE   Ketones, ur NEGATIVE NEGATIVE   Hgb urine dipstick NEGATIVE NEGATIVE   Protein, ur NEGATIVE NEGATIVE   Nitrites, Initial NEGATIVE NEGATIVE   Leukocyte Esterase TRACE (A) NEGATIVE   WBC, UA NONE SEEN 0 - 5 /HPF   RBC / HPF NONE SEEN 0 - 2 /HPF   Squamous Epithelial / HPF 10-20 (A) < OR = 5 /HPF   Bacteria, UA NONE SEEN NONE SEEN /HPF   Hyaline Cast NONE SEEN NONE SEEN /LPF   Note      Comment: This urine was analyzed for the presence of WBC,  RBC, bacteria,  casts, and other formed elements.  Only those elements seen were reported. . .   Thyroid  Panel With TSH     Status: None   Collection Time: 08/21/23  9:53 AM  Result Value Ref Range   T3 Uptake 26 22 - 35 %   T4, Total 9.7 5.1 - 11.9 mcg/dL   Free Thyroxine Index 2.5 1.4 - 3.8   TSH 0.92 mIU/L    Comment:           Reference Range .           > or = 20 Years  0.40-4.50 .  Pregnancy Ranges           First trimester    0.26-2.66           Second trimester   0.55-2.73           Third trimester    0.43-2.91   Urine Culture     Status: Abnormal   Collection Time: 08/21/23  9:53 AM  Result Value Ref Range   MICRO NUMBER: [id number]    SPECIMEN QUALITY: Adequate    Sample Source URINE    STATUS: FINAL    ISOLATE 1: Escherichia coli (A)     Comment: Greater than 100,000 CFU/mL of Escherichia coli      Susceptibility   Escherichia coli - URINE CULTURE, REFLEX    AMOX/CLAVULANIC <=2 Sensitive     AMPICILLIN/SULBACTAM <=2 Sensitive     CEFAZOLIN * <=4 Not Reportable      * For infections other than uncomplicated UTI caused by E. coli, K. pneumoniae or P. mirabilis: Cefazolin  is resistant if MIC > or = 8 mcg/mL. (Distinguishing susceptible versus intermediate for isolates with MIC < or = 4 mcg/mL requires additional testing.) For uncomplicated UTI caused by E. coli, K. pneumoniae or P. mirabilis: Cefazolin  is susceptible if MIC <32 mcg/mL and predicts susceptible to the oral agents cefaclor, cefdinir, cefpodoxime, cefprozil, cefuroxime, cephalexin and loracarbef.     CEFTAZIDIME <=1 Sensitive     CEFEPIME <=0.12 Sensitive     CEFTRIAXONE <=0.25 Sensitive     CIPROFLOXACIN <=0.06 Sensitive     LEVOFLOXACIN <=0.12 Sensitive     GENTAMICIN <=1 Sensitive     IMIPENEM <=0.25 Sensitive     MEROPENEM <=0.25 Sensitive     NITROFURANTOIN <=16 Sensitive     PIP/TAZO <=4 Sensitive     TRIMETH/SULFA* <=20 Sensitive      * For infections other than uncomplicated  UTI caused by E. coli, K. pneumoniae or P. mirabilis: Cefazolin  is resistant if MIC > or = 8 mcg/mL. (Distinguishing susceptible versus intermediate for isolates with MIC < or = 4 mcg/mL requires additional testing.) For uncomplicated UTI caused by E. coli, K. pneumoniae or P. mirabilis: Cefazolin  is susceptible if MIC <32 mcg/mL and predicts susceptible to the oral agents cefaclor, cefdinir, cefpodoxime, cefprozil, cefuroxime, cephalexin and loracarbef. Legend: S = Susceptible  I = Intermediate R = Resistant  NS = Not susceptible SDD = Susceptible Dose Dependent * = Not Tested  NR = Not Reported **NN = See Therapy Comments   REFLEXIVE URINE CULTURE     Status: None   Collection Time: 08/21/23  9:53 AM  Result Value Ref Range   REFLEXIVE URINE CULTURE      Comment: CULTURE INDICATED - RESULTS TO FOLLOW    Lab Results  Component Value Date   CHOL 146 08/21/2023   CHOL 147 08/17/2021   Lab Results  Component Value Date   HDL 52.00 08/21/2023   HDL 48.20 08/17/2021   Lab Results  Component Value Date   LDLCALC 69 08/21/2023   LDLCALC 69 08/17/2021   Lab Results  Component Value Date   TRIG 123.0 08/21/2023   TRIG 147.0 08/17/2021   Lab Results  Component Value Date   CHOLHDL 3 08/21/2023   CHOLHDL 3 08/17/2021   No results found for: LDLDIRECT  Last metabolic panel Lab Results  Component Value Date   GLUCOSE 92 08/21/2023   NA 135 08/21/2023   K 3.8 08/21/2023   CL 97 08/21/2023   CO2 28 08/21/2023  BUN 16 08/21/2023   CREATININE 0.89 08/21/2023   GFR 81.85 08/21/2023   CALCIUM 9.9 08/21/2023   PROT 7.8 08/21/2023   ALBUMIN 4.8 08/21/2023   BILITOT 0.6 08/21/2023   ALKPHOS 78 08/21/2023   AST 19 08/21/2023   ALT 12 08/21/2023   ANIONGAP 8 09/05/2022    Lab Results  Component Value Date   HGBA1C 5.5 08/21/2023    Last CBC Lab Results  Component Value Date   WBC 9.1 08/21/2023   HGB 15.0 08/21/2023   HCT 44.3 08/21/2023   MCV 85.0  08/21/2023   MCH 28.2 09/05/2022   RDW 13.2 08/21/2023   PLT 218.0 08/21/2023    Lab Results  Component Value Date   TSH 0.92 08/21/2023    No results found for: PSA1, PSA  Last vitamin D  No results found for: Lucetta Russel, VD25OH  Lab Results  Component Value Date   COLORU yellow 07/09/2022   CLARITYU clear 07/09/2022   GLUCOSEUR Negative 07/09/2022   BILIRUBINUR negative 07/09/2022   KETONESU negative 07/09/2022   SPECGRAV 1.010 07/09/2022   RBCUR negative 07/09/2022   PHUR 7.0 07/09/2022   PROTEINUR NEGATIVE 08/21/2023   UROBILINOGEN 0.2 07/09/2022   LEUKOCYTESUR Negative 07/09/2022    Lab Results  Component Value Date   MICROALBUR <0.7 08/21/2023     At today's visit, we discussed treatment options, associated risk and benefits, and engage in counseling as needed.  Additionally the following were reviewed: Past medical records, past medical and surgical history, family and social background, as well as relevant laboratory results, imaging findings, and specialty notes, where applicable.  This message was generated using dictation software, and as a result, it may contain unintentional typos or errors.  Nevertheless, extensive effort was made to accurately convey at the pertinent aspects of the patient visit.    There may have been are other unrelated non-urgent complaints, but due to the busy schedule and the amount of time already spent with her, time does not permit to address these issues at today's visit. Another appointment may have or has been requested to review these additional issues.     Harle Libra, MD, MS

## 2023-10-17 ENCOUNTER — Encounter: Payer: Self-pay | Admitting: Family Medicine

## 2023-10-17 DIAGNOSIS — F411 Generalized anxiety disorder: Secondary | ICD-10-CM

## 2023-10-17 MED ORDER — ESCITALOPRAM OXALATE 10 MG PO TABS
10.0000 mg | ORAL_TABLET | Freq: Every day | ORAL | 0 refills | Status: DC
Start: 2023-10-17 — End: 2023-10-31

## 2023-10-17 NOTE — Telephone Encounter (Signed)
 Spoke with Roselie Mood, NP about patient's MyChart message and refill medication request. She stated to send patient a 30 day supply with no refills and to call patient and reply back with a MyChart message informing her to call the office as soon as possible to get scheduled for the 1 month follow up.

## 2023-10-17 NOTE — Telephone Encounter (Signed)
 Called patient and left a generic voice message asking to give me a call back at the office at [phone] since there is not a DPR on file. I also sent a MyChart message to patient informing her to call office to schedule a 1 month follow up

## 2023-10-31 ENCOUNTER — Ambulatory Visit (INDEPENDENT_AMBULATORY_CARE_PROVIDER_SITE_OTHER): Admitting: Family Medicine

## 2023-10-31 DIAGNOSIS — F411 Generalized anxiety disorder: Secondary | ICD-10-CM | POA: Diagnosis not present

## 2023-10-31 MED ORDER — ESCITALOPRAM OXALATE 10 MG PO TABS
10.0000 mg | ORAL_TABLET | Freq: Every day | ORAL | 3 refills | Status: AC
Start: 2023-10-31 — End: 2024-10-25

## 2023-10-31 NOTE — Progress Notes (Signed)
 Assessment & Plan   Assessment/Plan:    Assessment & Plan Hypertension Hypertension is well-managed with current treatment and she reports feeling much better. Improved with better control of anxiety.  - continue current medication  Anxiety Anxiety symptoms have improved significantly. She reports better sleep and reduced worry. The transition from escitalopram  5 mg to 10 mg was smooth with no side effects. - continue escitalopram  10 mg daily        Medications Discontinued During This Encounter  Medication Reason   escitalopram  (LEXAPRO ) 10 MG tablet     Return in about 3 months (around 01/31/2024) for mood, blood pressure.        Subjective:   Encounter date: 10/31/2023  Jasmine Baird is a 38 y.o. female who has Chronic hypertension; Asthma; Seasonal allergies; Fibroids; Hypokalemia; Encounter for preoperative assessment; Class 1 obesity with serious comorbidity in adult; Intramural, submucous, and subserous leiomyoma of uterus; Encounter for IUD removal; Asymptomatic bacteriuria; and GAD (generalized anxiety disorder) on their problem list..   She  has a past medical history of Asthma, Fibroids, History of pre-eclampsia, Hypertension, and Hypokalemia..   She presents with chief complaint of Medical Management of Chronic Issues (Blood pressure and anxiety ) .   Discussed the use of AI scribe software for clinical note transcription with the patient, who gave verbal consent to proceed.  History of Present Illness Jasmine Baird is a 38 year old female with hypertension and anxiety who presents for follow-up.  She reports significant improvement in her anxiety symptoms, noting that she is sleeping better at night, which she identifies as the most substantial improvement. She no longer experiences constant worry and feels that her mind is quieter.  Her medication regimen includes escitalopram , which was recently increased from 5 mg to 10 mg. She reports a  smooth transition without any side effects.       Past Surgical History:  Procedure Laterality Date   CYSTOSCOPY N/A 09/10/2022   Procedure: CYSTOSCOPY;  Surgeon: Jeralyn Crutch, MD;  Location: [facility];  Service: Gynecology;  Laterality: N/A;   IUD REMOVAL  09/10/2022   Procedure: INTRAUTERINE DEVICE (IUD) REMOVAL;  Surgeon: Jeralyn Crutch, MD;  Location: [facility];  Service: Gynecology;;   ROBOTIC ASSISTED TOTAL HYSTERECTOMY WITH BILATERAL SALPINGO OOPHERECTOMY Bilateral 09/10/2022   Procedure: XI ROBOTIC ASSISTED TOTAL HYSTERECTOMY WITH BILATERAL SALPINGECTOMY;  Surgeon: Jeralyn Crutch, MD;  Location: [facility];  Service: Gynecology;  Laterality: Bilateral;    Outpatient Medications Prior to Visit  Medication Sig Dispense Refill   cetirizine  (ZYRTEC ) 10 MG tablet Take 1 tablet (10 mg total) by mouth daily. 90 tablet 3   chlorthalidone  (HYGROTON ) 25 MG tablet Take 2 tablets (50 mg total) by mouth daily. 90 tablet 3   fluticasone  (FLONASE ) 50 MCG/ACT nasal spray Place 2 sprays into both nostrils daily. 1 g 11   montelukast  (SINGULAIR ) 10 MG tablet Take 1 tablet (10 mg total) by mouth at bedtime. 90 tablet 3   potassium chloride  SA (KLOR-CON  M) 10 MEQ tablet Take 1 tablet (10 mEq total) by mouth daily. 90 tablet 3   spironolactone  (ALDACTONE ) 50 MG tablet Take 1 tablet (50 mg total) by mouth daily. 90 tablet 3   escitalopram  (LEXAPRO ) 10 MG tablet Take 1 tablet (10 mg total) by mouth daily. 30 tablet 0   albuterol  (VENTOLIN  HFA) 108 (90 Base) MCG/ACT inhaler Inhale 1-2 puffs into the lungs every 6 (six) hours as needed for wheezing or shortness of  breath. 1 each 11   No facility-administered medications prior to visit.    Family History  Problem Relation Age of Onset   Hypertension Mother    Cancer Father    Hypertension Father    Hyperlipidemia Father    Asthma Daughter    Allergies Daughter    Asthma Son    Heart  disease Maternal Uncle    Hyperlipidemia Maternal Grandmother    Diabetes Maternal Grandmother    Hypertension Maternal Grandmother    Cancer Maternal Grandfather 22       PANCREATIC    Social History   Socioeconomic History   Marital status: Divorced    Spouse name: Alfonzo Mckissic   Number of children: 2   Years of education: Not on file   Highest education level: Associate degree: occupational, Scientist, product/process development, or vocational program  Occupational History   Occupation: Paralegal  Tobacco Use   Smoking status: Never    Passive exposure: Never   Smokeless tobacco: Never  Vaping Use   Vaping status: Never Used  Substance and Sexual Activity   Alcohol use: Yes    Comment: very rare   Drug use: No   Sexual activity: Yes    Birth control/protection: I.U.D.  Other Topics Concern   Not on file  Social History Narrative   1. Son, [name], 33 yo,    2. Daughter, Sheffield, 40 yo    Social Drivers of Corporate investment banker Strain: Low Risk  (09/21/2023)   Overall Financial Resource Strain (CARDIA)    Difficulty of Paying Living Expenses: Not very hard  Food Insecurity: No Food Insecurity (09/21/2023)   Hunger Vital Sign    Worried About Running Out of Food in the Last Year: Never true    Ran Out of Food in the Last Year: Never true  Transportation Needs: No Transportation Needs (09/21/2023)   PRAPARE - Administrator, Civil Service (Medical): No    Lack of Transportation (Non-Medical): No  Physical Activity: Insufficiently Active (09/21/2023)   Exercise Vital Sign    Days of Exercise per Week: 1 day    Minutes of Exercise per Session: 40 min  Stress: Stress Concern Present (09/21/2023)   [employer] of Occupational Health - Occupational Stress Questionnaire    Feeling of Stress: Rather much  Social Connections: Socially Isolated (09/21/2023)   Social Connection and Isolation Panel    Frequency of Communication with Friends and Family: More than three times a  week    Frequency of Social Gatherings with Friends and Family: Once a week    Attends Religious Services: Never    Database administrator or Organizations: No    Attends Engineer, structural: Not on file    Marital Status: Divorced  Intimate Partner Violence: Not on file                                                                                                  Objective:  Physical Exam: BP 128/80   Pulse 90   Temp 98.3 F (36.8 C)   Ht 5'  1 (1.549 m)   Wt 161 lb 9.6 oz (73.3 kg)   LMP 03/25/2022   SpO2 99%   BMI 30.53 kg/m    Physical Exam GENERAL: Alert, cooperative, well developed, no acute distress HEENT: Normocephalic, normal oropharynx, moist mucous membranes CHEST: Clear to auscultation bilaterally, No wheezes, rhonchi, or crackles CARDIOVASCULAR: Normal heart rate and rhythm, S1 and S2 normal without murmurs ABDOMEN: Soft, non-tender, non-distended, without organomegaly, Normal bowel sounds EXTREMITIES: No cyanosis or edema NEUROLOGICAL: Cranial nerves grossly intact, Moves all extremities without gross motor or sensory deficit   Physical Exam  No results found.  Recent Results (from the past 2160 hours)  Lipid panel     Status: None   Collection Time: 08/21/23  9:53 AM  Result Value Ref Range   Cholesterol 146 0 - 200 mg/dL    Comment: ATP III Classification       Desirable:  < 200 mg/dL               Borderline High:  200 - 239 mg/dL          High:  > = 759 mg/dL   Triglycerides 876.9 0.0 - 149.0 mg/dL    Comment: Normal:  <849 mg/dLBorderline High:  150 - 199 mg/dL   HDL 47.99 >60.99 mg/dL   VLDL 75.3 0.0 - 59.9 mg/dL   LDL Cholesterol 69 0 - 99 mg/dL   Total CHOL/HDL Ratio 3     Comment:                Men          Women1/2 Average Risk     3.4          3.3Average Risk          5.0          4.42X Average Risk          9.6          7.13X Average Risk          15.0          11.0                       NonHDL 94.00     Comment: NOTE:   Non-HDL goal should be 30 mg/dL higher than patient's LDL goal (i.e. LDL goal of < 70 mg/dL, would have non-HDL goal of < 100 mg/dL)  Hemoglobin J8r     Status: None   Collection Time: 08/21/23  9:53 AM  Result Value Ref Range   Hgb A1c MFr Bld 5.5 4.6 - 6.5 %    Comment: Glycemic Control Guidelines for People with Diabetes:Non Diabetic:  <6%Goal of Therapy: <7%Additional Action Suggested:  >8%   Microalbumin / creatinine urine ratio     Status: None   Collection Time: 08/21/23  9:53 AM  Result Value Ref Range   Microalb, Ur <0.7 mg/dL   Creatinine,U 01.6 mg/dL   Microalb Creat Ratio Unable to calculate 0.0 - 30.0 mg/g    Comment: Unable to Calculate due to Microalbumin Result of <0.7 mg/dL  Vitamin D  1,25 dihydroxy     Status: None   Collection Time: 08/21/23  9:53 AM  Result Value Ref Range   Vitamin D  1, 25 (OH)2 Total 19 18 - 72 pg/mL   Vitamin D3 1, 25 (OH)2 19 pg/mL   Vitamin D2 1, 25 (OH)2 <8 pg/mL    Comment: (Note) Vitamin D3, 1,25(OH)2 indicates both endogenous  production and supplementation. Vitamin D2, 1,25(OH)2 is  an indicator of exogenous sources, such as diet or  supplementation. Interpretation and therapy are based on  measurement of Vitamin D , 1,25 (OH)2, Total. . This test was developed, and its analytical performance  characteristics have been determined by [employer]. It has not been cleared or approved by the  FDA. This assay has been validated pursuant to the CLIA  regulations and is used for clinical purposes. . For additional information, please refer to http://education.[facility].com/faq/FAQ199 (This link is being provided for  informational/educational purposes only.) . [facility] [address] [place] [address] [id number] Johanna Agent L. Gino, MD, PhD   CBC with Differential/Platelet     Status: Abnormal   Collection Time: 08/21/23  9:53 AM  Result Value Ref Range   WBC 9.1 4.0 - 10.5 K/uL    RBC 5.21 (H) 3.87 - 5.11 Mil/uL   Hemoglobin 15.0 12.0 - 15.0 g/dL   HCT 55.6 63.9 - 53.9 %   MCV 85.0 78.0 - 100.0 fl   MCHC 33.9 30.0 - 36.0 g/dL   RDW 86.7 88.4 - 84.4 %   Platelets 218.0 150.0 - 400.0 K/uL   Neutrophils Relative % 67.3 43.0 - 77.0 %   Lymphocytes Relative 23.6 12.0 - 46.0 %   Monocytes Relative 5.8 3.0 - 12.0 %   Eosinophils Relative 2.8 0.0 - 5.0 %   Basophils Relative 0.5 0.0 - 3.0 %   Neutro Abs 6.1 1.4 - 7.7 K/uL   Lymphs Abs 2.1 0.7 - 4.0 K/uL   Monocytes Absolute 0.5 0.1 - 1.0 K/uL   Eosinophils Absolute 0.3 0.0 - 0.7 K/uL   Basophils Absolute 0.0 0.0 - 0.1 K/uL  Comprehensive metabolic panel with GFR     Status: None   Collection Time: 08/21/23  9:53 AM  Result Value Ref Range   Sodium 135 135 - 145 mEq/L   Potassium 3.8 3.5 - 5.1 mEq/L   Chloride 97 96 - 112 mEq/L   CO2 28 19 - 32 mEq/L   Glucose, Bld 92 70 - 99 mg/dL   BUN 16 6 - 23 mg/dL   Creatinine, Ser 9.10 0.40 - 1.20 mg/dL   Total Bilirubin 0.6 0.2 - 1.2 mg/dL   Alkaline Phosphatase 78 39 - 117 U/L   AST 19 0 - 37 U/L   ALT 12 0 - 35 U/L   Total Protein 7.8 6.0 - 8.3 g/dL   Albumin 4.8 3.5 - 5.2 g/dL   GFR 18.14 >39.99 mL/min    Comment: Calculated using the CKD-EPI Creatinine Equation (2021)   Calcium 9.9 8.4 - 10.5 mg/dL  Urinalysis w microscopic + reflex cultur     Status: Abnormal   Collection Time: 08/21/23  9:53 AM   Specimen: Blood  Result Value Ref Range   Color, Urine YELLOW YELLOW   APPearance CLEAR CLEAR   Specific Gravity, Urine 1.013 1.001 - 1.035   pH 8.0 5.0 - 8.0   Glucose, UA NEGATIVE NEGATIVE   Bilirubin Urine NEGATIVE NEGATIVE   Ketones, ur NEGATIVE NEGATIVE   Hgb urine dipstick NEGATIVE NEGATIVE   Protein, ur NEGATIVE NEGATIVE   Nitrites, Initial NEGATIVE NEGATIVE   Leukocyte Esterase TRACE (A) NEGATIVE   WBC, UA NONE SEEN 0 - 5 /HPF   RBC / HPF NONE SEEN 0 - 2 /HPF   Squamous Epithelial / HPF 10-20 (A) < OR = 5 /HPF   Bacteria, UA NONE SEEN NONE SEEN /  HPF    Hyaline Cast NONE SEEN NONE SEEN /LPF   Note      Comment: This urine was analyzed for the presence of WBC,  RBC, bacteria, casts, and other formed elements.  Only those elements seen were reported. . .   Thyroid  Panel With TSH     Status: None   Collection Time: 08/21/23  9:53 AM  Result Value Ref Range   T3 Uptake 26 22 - 35 %   T4, Total 9.7 5.1 - 11.9 mcg/dL   Free Thyroxine Index 2.5 1.4 - 3.8   TSH 0.92 mIU/L    Comment:           Reference Range .           > or = 20 Years  0.40-4.50 .                Pregnancy Ranges           First trimester    0.26-2.66           Second trimester   0.55-2.73           Third trimester    0.43-2.91   Urine Culture     Status: Abnormal   Collection Time: 08/21/23  9:53 AM  Result Value Ref Range   MICRO NUMBER: [id number]    SPECIMEN QUALITY: Adequate    Sample Source URINE    STATUS: FINAL    ISOLATE 1: Escherichia coli (A)     Comment: Greater than 100,000 CFU/mL of Escherichia coli      Susceptibility   Escherichia coli - URINE CULTURE, REFLEX    AMOX/CLAVULANIC <=2 Sensitive     AMPICILLIN/SULBACTAM <=2 Sensitive     CEFAZOLIN * <=4 Not Reportable      * For infections other than uncomplicated UTI caused by E. coli, K. pneumoniae or P. mirabilis: Cefazolin  is resistant if MIC > or = 8 mcg/mL. (Distinguishing susceptible versus intermediate for isolates with MIC < or = 4 mcg/mL requires additional testing.) For uncomplicated UTI caused by E. coli, K. pneumoniae or P. mirabilis: Cefazolin  is susceptible if MIC <32 mcg/mL and predicts susceptible to the oral agents cefaclor, cefdinir, cefpodoxime, cefprozil, cefuroxime, cephalexin and loracarbef.     CEFTAZIDIME <=1 Sensitive     CEFEPIME <=0.12 Sensitive     CEFTRIAXONE <=0.25 Sensitive     CIPROFLOXACIN <=0.06 Sensitive     LEVOFLOXACIN <=0.12 Sensitive     GENTAMICIN <=1 Sensitive     IMIPENEM <=0.25 Sensitive     MEROPENEM <=0.25 Sensitive     NITROFURANTOIN <=16  Sensitive     PIP/TAZO <=4 Sensitive     TRIMETH/SULFA* <=20 Sensitive      * For infections other than uncomplicated UTI caused by E. coli, K. pneumoniae or P. mirabilis: Cefazolin  is resistant if MIC > or = 8 mcg/mL. (Distinguishing susceptible versus intermediate for isolates with MIC < or = 4 mcg/mL requires additional testing.) For uncomplicated UTI caused by E. coli, K. pneumoniae or P. mirabilis: Cefazolin  is susceptible if MIC <32 mcg/mL and predicts susceptible to the oral agents cefaclor, cefdinir, cefpodoxime, cefprozil, cefuroxime, cephalexin and loracarbef. Legend: S = Susceptible  I = Intermediate R = Resistant  NS = Not susceptible SDD = Susceptible Dose Dependent * = Not Tested  NR = Not Reported **NN = See Therapy Comments   REFLEXIVE URINE CULTURE     Status: None   Collection Time: 08/21/23  9:53 AM  Result Value Ref  Range   REFLEXIVE URINE CULTURE      Comment: CULTURE INDICATED - RESULTS TO FOLLOW  Basic Metabolic Panel (BMET)     Status: None   Collection Time: 09/25/23  9:52 AM  Result Value Ref Range   Sodium 135 135 - 145 mEq/L   Potassium 3.8 3.5 - 5.1 mEq/L   Chloride 99 96 - 112 mEq/L   CO2 31 19 - 32 mEq/L   Glucose, Bld 91 70 - 99 mg/dL   BUN 15 6 - 23 mg/dL   Creatinine, Ser 9.14 0.40 - 1.20 mg/dL   GFR 13.56 >39.99 mL/min    Comment: Calculated using the CKD-EPI Creatinine Equation (2021)   Calcium 9.9 8.4 - 10.5 mg/dL        Beverley Adine Hummer, MD, MS

## 2023-11-20 ENCOUNTER — Other Ambulatory Visit: Payer: Self-pay | Admitting: Family Medicine

## 2023-11-20 ENCOUNTER — Other Ambulatory Visit: Payer: Self-pay

## 2023-11-20 ENCOUNTER — Encounter: Payer: Self-pay | Admitting: Family Medicine

## 2023-11-20 DIAGNOSIS — E876 Hypokalemia: Secondary | ICD-10-CM

## 2023-11-20 DIAGNOSIS — I10 Essential (primary) hypertension: Secondary | ICD-10-CM

## 2023-11-20 MED ORDER — SPIRONOLACTONE 50 MG PO TABS
50.0000 mg | ORAL_TABLET | Freq: Every day | ORAL | 3 refills | Status: AC
Start: 2023-11-20 — End: 2024-11-14

## 2023-11-24 ENCOUNTER — Other Ambulatory Visit: Payer: Self-pay | Admitting: Family Medicine

## 2023-11-24 DIAGNOSIS — I10 Essential (primary) hypertension: Secondary | ICD-10-CM

## 2023-11-24 DIAGNOSIS — F411 Generalized anxiety disorder: Secondary | ICD-10-CM

## 2023-11-24 DIAGNOSIS — E876 Hypokalemia: Secondary | ICD-10-CM

## 2023-11-24 NOTE — Telephone Encounter (Signed)
 Copied from CRM #[id number]. Topic: Clinical - Medication Refill >> Nov 24, 2023 10:14 AM Suzen RAMAN wrote: Medication: escitalopram  (LEXAPRO ) 10 MG tablet  Has the patient contacted their pharmacy? Yes  This is the patient's preferred pharmacy:  [facility] [address] [place], [place] - [address] [place] [address] Phone: [phone] Fax: [phone]  Is this the correct pharmacy for this prescription? Yes If no, delete pharmacy and type the correct one.   Has the prescription been filled recently? No  Is the patient out of the medication? Yes  Has the patient been seen for an appointment in the last year OR does the patient have an upcoming appointment? Yes  Can we respond through MyChart? Yes  Agent: Please be advised that Rx refills may take up to 3 business days. We ask that you follow-up with your pharmacy.

## 2023-12-10 DIAGNOSIS — Z23 Encounter for immunization: Secondary | ICD-10-CM | POA: Diagnosis not present

## 2024-02-21 ENCOUNTER — Other Ambulatory Visit: Payer: Self-pay | Admitting: Family Medicine

## 2024-02-21 DIAGNOSIS — I10 Essential (primary) hypertension: Secondary | ICD-10-CM

## 2024-02-21 DIAGNOSIS — E876 Hypokalemia: Secondary | ICD-10-CM

## 2024-02-24 ENCOUNTER — Encounter: Payer: Self-pay | Admitting: Family Medicine

## 2024-03-17 DIAGNOSIS — Z713 Dietary counseling and surveillance: Secondary | ICD-10-CM | POA: Diagnosis not present
